# Patient Record
Sex: Female | Born: 1960 | Race: White | Hispanic: No | Marital: Married | Smoking: Never smoker
Health system: Southern US, Community
[De-identification: ages and names within clinical notes are randomized; demographics above are authoritative.]

## PROBLEM LIST (undated history)

## (undated) DIAGNOSIS — Z789 Other specified health status: Secondary | ICD-10-CM

## (undated) DIAGNOSIS — F329 Major depressive disorder, single episode, unspecified: Secondary | ICD-10-CM

## (undated) DIAGNOSIS — M5136 Other intervertebral disc degeneration, lumbar region: Secondary | ICD-10-CM

## (undated) DIAGNOSIS — T7840XA Allergy, unspecified, initial encounter: Secondary | ICD-10-CM

## (undated) DIAGNOSIS — F32A Depression, unspecified: Secondary | ICD-10-CM

## (undated) DIAGNOSIS — D72829 Elevated white blood cell count, unspecified: Secondary | ICD-10-CM

## (undated) HISTORY — DX: Depression, unspecified: F32.A

## (undated) HISTORY — DX: Other specified health status: Z78.9

## (undated) HISTORY — DX: Other intervertebral disc degeneration, lumbar region: M51.36

## (undated) HISTORY — DX: Allergy, unspecified, initial encounter: T78.40XA

## (undated) HISTORY — DX: Major depressive disorder, single episode, unspecified: F32.9

## (undated) HISTORY — DX: Elevated white blood cell count, unspecified: D72.829

## (undated) HISTORY — PX: ABDOMINAL HYSTERECTOMY: SHX81

---

## 2000-12-10 ENCOUNTER — Encounter: Payer: Self-pay | Admitting: Family Medicine

## 2000-12-10 ENCOUNTER — Encounter: Admission: RE | Admit: 2000-12-10 | Discharge: 2000-12-10 | Payer: Self-pay | Admitting: Family Medicine

## 2000-12-12 ENCOUNTER — Encounter: Payer: Self-pay | Admitting: Family Medicine

## 2000-12-12 ENCOUNTER — Encounter: Admission: RE | Admit: 2000-12-12 | Discharge: 2000-12-12 | Payer: Self-pay | Admitting: Family Medicine

## 2005-05-02 ENCOUNTER — Ambulatory Visit: Payer: Self-pay | Admitting: Internal Medicine

## 2010-06-19 ENCOUNTER — Encounter: Payer: Self-pay | Admitting: Family Medicine

## 2011-07-12 ENCOUNTER — Ambulatory Visit (INDEPENDENT_AMBULATORY_CARE_PROVIDER_SITE_OTHER): Payer: BC Managed Care – PPO | Admitting: Family Medicine

## 2011-07-12 ENCOUNTER — Encounter: Payer: Self-pay | Admitting: Family Medicine

## 2011-07-12 DIAGNOSIS — J301 Allergic rhinitis due to pollen: Secondary | ICD-10-CM

## 2011-07-12 DIAGNOSIS — F325 Major depressive disorder, single episode, in full remission: Secondary | ICD-10-CM | POA: Insufficient documentation

## 2011-07-12 DIAGNOSIS — F3289 Other specified depressive episodes: Secondary | ICD-10-CM

## 2011-07-12 DIAGNOSIS — F329 Major depressive disorder, single episode, unspecified: Secondary | ICD-10-CM

## 2011-07-12 DIAGNOSIS — Z780 Asymptomatic menopausal state: Secondary | ICD-10-CM

## 2011-07-12 LAB — LIPID PANEL
Cholesterol: 207 mg/dL — ABNORMAL HIGH (ref 0–200)
HDL: 90 mg/dL (ref >39.00)
Total CHOL/HDL Ratio: 2
Triglycerides: 33 mg/dL (ref 0.0–149.0)
VLDL: 6.6 mg/dL (ref 0.0–40.0)

## 2011-07-12 LAB — CBC WITH DIFFERENTIAL/PLATELET
Basophils Relative: 0.2 % (ref 0.0–3.0)
Eosinophils Absolute: 0 10*3/uL (ref 0.0–0.7)
MCHC: 34.8 g/dL (ref 30.0–36.0)
MCV: 96.6 fl (ref 78.0–100.0)
Monocytes Absolute: 0.3 10*3/uL (ref 0.1–1.0)
Neutrophils Relative %: 47.6 % (ref 43.0–77.0)
Platelets: 212 10*3/uL (ref 150.0–400.0)
RBC: 4.09 Mil/uL (ref 3.87–5.11)
RDW: 12.5 % (ref 11.5–14.6)

## 2011-07-12 LAB — POCT URINALYSIS DIPSTICK
Protein, UA: NEGATIVE
Spec Grav, UA: <=1.005
Urobilinogen, UA: 0.2

## 2011-07-12 LAB — TSH: TSH: 1.22 u[IU]/mL (ref 0.35–5.50)

## 2011-07-12 LAB — BASIC METABOLIC PANEL
BUN: 18 mg/dL (ref 6–23)
CO2: 28 mEq/L (ref 19–32)
Calcium: 9.4 mg/dL (ref 8.4–10.5)
Chloride: 100 mEq/L (ref 96–112)
Creatinine, Ser: 0.9 mg/dL (ref 0.4–1.2)
GFR: 71.24 mL/min (ref >60.00)
Glucose, Bld: 96 mg/dL (ref 70–99)
Potassium: 5 mEq/L (ref 3.5–5.1)
Sodium: 137 mEq/L (ref 135–145)

## 2011-07-12 LAB — HEPATIC FUNCTION PANEL
ALT: 26 U/L (ref 0–35)
AST: 25 U/L (ref 0–37)
Albumin: 4.4 g/dL (ref 3.5–5.2)
Alkaline Phosphatase: 41 U/L (ref 39–117)
Bilirubin, Direct: 0.1 mg/dL (ref 0.0–0.3)
Total Bilirubin: 0.9 mg/dL (ref 0.3–1.2)
Total Protein: 7.1 g/dL (ref 6.0–8.3)

## 2011-07-12 MED ORDER — IMIPRAMINE PAMOATE 100 MG PO CAPS: 100.0000 mg | ORAL_CAPSULE | Freq: Every day | ORAL | Status: DC

## 2011-07-12 NOTE — Progress Notes (Signed)
  Subjective:    Patient ID: Carmen Allen, female    DOB: 01/23/1961, 51 y.o.   MRN: [id number]  HPI Carmen Allen is a 51 year old married female G0 P0 who had a TAH and BSO when she was 51 years of age for endometriosis who comes in today for a general medical exam  She has a history of mild depression for which she takes imipramine 100 mg daily  She has tried to taper off in the past but every time she does she feels depressed  She takes HRT the her GYN  She has a history of migraine headaches however has not had a migraine in a couple years  She has a history of allergic rhinitis for which she takes Zyrtec 10 mg daily  She gets routine dental care. Never had a colonoscopy. Unknown about tetanus booster. Family history negative except for father had diabetes and social history she is married lives here in [place] works as a Art therapist for a company. Nonsmoker and exercises on a regular basis   Review of Systems  Constitutional: Negative.   HENT: Negative.   Eyes: Negative.   Respiratory: Negative.   Cardiovascular: Negative.   Gastrointestinal: Negative.   Genitourinary: Negative.   Musculoskeletal: Negative.   Neurological: Negative.   Hematological: Negative.   Psychiatric/Behavioral: Negative.        Objective:   Physical Exam  Constitutional: She appears well-developed and well-nourished.  HENT:  Head: Normocephalic and atraumatic.  Right Ear: External ear normal.  Left Ear: External ear normal.  Nose: Nose normal.  Mouth/Throat: Oropharynx is clear and moist.  Eyes: EOM are normal. Pupils are equal, round, and reactive to light.  Neck: Normal range of motion. Neck supple. No thyromegaly present.  Cardiovascular: Normal rate, regular rhythm, normal heart sounds and intact distal pulses.  Exam reveals no gallop and no friction rub.   No murmur heard. Pulmonary/Chest: Effort normal and breath sounds normal.  Abdominal: Soft. Bowel sounds are normal. She  exhibits no distension and no mass. There is no tenderness. There is no rebound.  Musculoskeletal: Normal range of motion.  Lymphadenopathy:    She has no cervical adenopathy.  Neurological: She is alert. She has normal reflexes. No cranial nerve deficit. She exhibits normal muscle tone. Coordination normal.  Skin: Skin is warm and dry.  Psychiatric: She has a normal mood and affect. Her behavior is normal. Judgment and thought content normal.          Assessment & Plan:  Healthy female  Allergic rhinitis continue Zyrtec  History of depression continued continue imipramine  History of hysterectomy and BSO at age 89 HRT via GYN recommend she consider tapering

## 2011-07-12 NOTE — Patient Instructions (Signed)
Continue your good health habits  Take a baby aspirin daily  We will set you up for a screening colonoscopy  I will call you I gets her lab work back  Return in one year or sooner if any problems

## 2011-09-19 ENCOUNTER — Encounter: Payer: Self-pay | Admitting: Gastroenterology

## 2012-05-29 HISTORY — PX: COLONOSCOPY: SHX174

## 2012-07-17 ENCOUNTER — Other Ambulatory Visit: Payer: Self-pay | Admitting: Family Medicine

## 2012-07-30 ENCOUNTER — Ambulatory Visit: Payer: BC Managed Care – PPO | Admitting: Family Medicine

## 2012-09-04 ENCOUNTER — Ambulatory Visit: Payer: BC Managed Care – PPO | Admitting: Family Medicine

## 2012-10-10 ENCOUNTER — Encounter: Payer: Self-pay | Admitting: Family Medicine

## 2012-10-10 ENCOUNTER — Other Ambulatory Visit: Payer: Self-pay | Admitting: *Deleted

## 2012-10-10 ENCOUNTER — Ambulatory Visit (INDEPENDENT_AMBULATORY_CARE_PROVIDER_SITE_OTHER): Payer: BC Managed Care – PPO | Admitting: Family Medicine

## 2012-10-10 VITALS — BP 102/60 | Temp 97.5°F | Wt 138.0 lb

## 2012-10-10 DIAGNOSIS — M5136 Other intervertebral disc degeneration, lumbar region: Secondary | ICD-10-CM

## 2012-10-10 DIAGNOSIS — Z780 Asymptomatic menopausal state: Secondary | ICD-10-CM

## 2012-10-10 DIAGNOSIS — Z23 Encounter for immunization: Secondary | ICD-10-CM

## 2012-10-10 DIAGNOSIS — M51369 Other intervertebral disc degeneration, lumbar region without mention of lumbar back pain or lower extremity pain: Secondary | ICD-10-CM

## 2012-10-10 DIAGNOSIS — F329 Major depressive disorder, single episode, unspecified: Secondary | ICD-10-CM

## 2012-10-10 DIAGNOSIS — M5137 Other intervertebral disc degeneration, lumbosacral region: Secondary | ICD-10-CM

## 2012-10-10 HISTORY — DX: Other intervertebral disc degeneration, lumbar region without mention of lumbar back pain or lower extremity pain: M51.369

## 2012-10-10 HISTORY — DX: Other intervertebral disc degeneration, lumbar region: M51.36

## 2012-10-10 MED ORDER — IMIPRAMINE PAMOATE 100 MG PO CAPS: ORAL_CAPSULE | ORAL | Status: DC

## 2012-10-10 NOTE — Patient Instructions (Signed)
Continue the Toprol one daily at bedtime  We will get you set up for a consult in GI to discuss a colonoscopy  Return in one year for general medical exam sooner if any problem

## 2012-10-10 NOTE — Progress Notes (Signed)
  Subjective:    Patient ID: Carmen Allen, female    DOB: 13-Jan-1961, 52 y.o.   MRN: [id number]  HPI Carmen Allen is a 52 year old female nonsmoker who comes in today for general physical examination because of a history of mild depression  She's been on Tofranil p.m. 100 mg and feels well and wishes to continue that medication  She takes estrogen 1 mg daily via her GYN. We discussed long-term negative effects of hormone replacement therapy  She gets routine eye care, dental care, BSE monthly, and you mammography, due for colonoscopy,,,,,,,, request will be sent to GI,,,,,,,,, tetanus booster unknown will give her a booster today.  She recently had an orthopedic evaluation because of soreness in her hip. She runs about 12 miles per week. She saw Dr. Farris Has x-ray shows some degenerative disease of her lumbar spine. She's relatively asymptomatic.   Review of Systems  Constitutional: Negative.   HENT: Negative.   Eyes: Negative.   Respiratory: Negative.   Cardiovascular: Negative.   Gastrointestinal: Negative.   Genitourinary: Negative.   Musculoskeletal: Negative.   Neurological: Negative.   Psychiatric/Behavioral: Negative.        Objective:   Physical Exam  Constitutional: She appears well-developed and well-nourished.  HENT:  Head: Normocephalic and atraumatic.  Right Ear: External ear normal.  Left Ear: External ear normal.  Nose: Nose normal.  Mouth/Throat: Oropharynx is clear and moist.  Eyes: EOM are normal. Pupils are equal, round, and reactive to light.  Neck: Normal range of motion. Neck supple. No thyromegaly present.  Cardiovascular: Normal rate, regular rhythm, normal heart sounds and intact distal pulses.  Exam reveals no gallop and no friction rub.   No murmur heard. Pulmonary/Chest: Effort normal and breath sounds normal.  Abdominal: Soft. Bowel sounds are normal. She exhibits no distension and no mass. There is no tenderness. There is no rebound.  Genitourinary:   Bilateral breast exam normal  Musculoskeletal: Normal range of motion.  Lymphadenopathy:    She has no cervical adenopathy.  Neurological: She is alert. She has normal reflexes. No cranial nerve deficit. She exhibits normal muscle tone. Coordination normal.  Skin: Skin is warm and dry.  Total body skin exam normal  Psychiatric: She has a normal mood and affect. Her behavior is normal. Judgment and thought content normal.          Assessment & Plan:  Healthy female  History of mild depression continue Tofranil 100 mg each bedtime  Post menopausal continue hormonal tablets 1 mg daily  Degenerative disc disease by x-ray,,,,,,,,,, consider changing to a different type of exercise program. She however does like her writing.

## 2012-11-11 ENCOUNTER — Encounter: Payer: Self-pay | Admitting: Gastroenterology

## 2012-12-31 ENCOUNTER — Ambulatory Visit (AMBULATORY_SURGERY_CENTER): Payer: BC Managed Care – PPO

## 2012-12-31 ENCOUNTER — Encounter: Payer: Self-pay | Admitting: Gastroenterology

## 2012-12-31 VITALS — Ht 67.5 in | Wt 137.6 lb

## 2012-12-31 DIAGNOSIS — Z1211 Encounter for screening for malignant neoplasm of colon: Secondary | ICD-10-CM

## 2012-12-31 MED ORDER — MOVIPREP 100 G PO SOLR: ORAL | Status: DC

## 2013-01-14 ENCOUNTER — Encounter: Payer: Self-pay | Admitting: Gastroenterology

## 2013-01-14 ENCOUNTER — Ambulatory Visit (AMBULATORY_SURGERY_CENTER): Payer: BC Managed Care – PPO | Admitting: Gastroenterology

## 2013-01-14 VITALS — BP 114/73 | HR 72 | Temp 98.3°F | Resp 13 | Ht 67.5 in | Wt 137.0 lb

## 2013-01-14 DIAGNOSIS — Z1211 Encounter for screening for malignant neoplasm of colon: Secondary | ICD-10-CM

## 2013-01-14 DIAGNOSIS — D126 Benign neoplasm of colon, unspecified: Secondary | ICD-10-CM

## 2013-01-14 MED ORDER — SODIUM CHLORIDE 0.9 % IV SOLN: 500.0000 mL | INTRAVENOUS | Status: DC

## 2013-01-14 NOTE — Progress Notes (Signed)
The pt tolerated the colonoscopy very well. Maw   

## 2013-01-14 NOTE — Op Note (Signed)
[facility] [address] [place], [address]   COLONOSCOPY PROCEDURE REPORT  PATIENT: Carmen Allen, Carmen Allen  MR#: [id number] BIRTHDATE: 01/06/1961 , 51  yrs. old GENDER: Female ENDOSCOPIST: Rachael Fee, MD REFERRED EX:[id number] Shawnie Dapper, M.D. PROCEDURE DATE:  01/14/2013 PROCEDURE:   Colonoscopy with snare polypectomy First Screening Colonoscopy - Avg.  risk and is 50 yrs.  old or older Yes.  Prior Negative Screening - Now for repeat screening. N/A  History of Adenoma - Now for follow-up colonoscopy & has been > or = to 3 yrs.  N/A  Polyps Removed Today? Yes. ASA CLASS:   Class II INDICATIONS:average risk screening. MEDICATIONS: Fentanyl 75 mcg IV, Versed 6 mg IV, and These medications were titrated to patient response per physician's verbal order  DESCRIPTION OF PROCEDURE:   After the risks benefits and alternatives of the procedure were thoroughly explained, informed consent was obtained.  A digital rectal exam revealed no abnormalities of the rectum.   The [id number]  endoscope was introduced through the anus and advanced to the cecum, which was identified by both the appendix and ileocecal valve. No adverse events experienced.   The quality of the prep was good, using MoviPrep  The instrument was then slowly withdrawn as the colon was fully examined.  COLON FINDINGS: Two polyps were found, removed and sent to pathology.  One was sessile, located in cecum, 4mm across, removed with cold snare.  The other was pedunculated, 10mm, located in sigmoid, removed with snare/cautery.  The examination was otherwise normal.  Retroflexed views revealed no abnormalities. The time to cecum=6 minutes 35 seconds.  Withdrawal time=9 minutes 12 seconds. The scope was withdrawn and the procedure completed. COMPLICATIONS: There were no complications.  ENDOSCOPIC IMPRESSION: Two polyps were found, removed and sent to pathology. The examination was otherwise  normal.  RECOMMENDATIONS: If the polyp(s) removed today are proven to be adenomatous (pre-cancerous) polyps, you will need a colonoscopy in 3 years. Otherwise you should continue to follow colorectal cancer screening guidelines for "routine risk" patients with a colonoscopy in 10 years.  You will receive a letter within 1-2 weeks with the results of your biopsy as well as final recommendations.  Please call my office if you have not received a letter after 3 weeks.   eSigned:  Rachael Fee, MD 01/14/2013 9:17 AM

## 2013-01-14 NOTE — Patient Instructions (Addendum)
YOU HAD AN ENDOSCOPIC PROCEDURE TODAY AT THE [facility]: Refer to the procedure report that was given to you for any specific questions about what was found during the examination.  If the procedure report does not answer your questions, please call your gastroenterologist to clarify.  If you requested that your care partner not be given the details of your procedure findings, then the procedure report has been included in a sealed envelope for you to review at your convenience later.  YOU SHOULD EXPECT: Some feelings of bloating in the abdomen. Passage of more gas than usual.  Walking can help get rid of the air that was put into your GI tract during the procedure and reduce the bloating. If you had a lower endoscopy (such as a colonoscopy or flexible sigmoidoscopy) you may notice spotting of blood in your stool or on the toilet paper. If you underwent a bowel prep for your procedure, then you may not have a normal bowel movement for a few days.  DIET: Your first meal following the procedure should be a light meal and then it is ok to progress to your normal diet.  A half-sandwich or bowl of soup is an example of a good first meal.  Heavy or fried foods are harder to digest and may make you feel nauseous or bloated.  Likewise meals heavy in dairy and vegetables can cause extra gas to form and this can also increase the bloating.  Drink plenty of fluids but you should avoid alcoholic beverages for 24 hours.  ACTIVITY: Your care partner should take you home directly after the procedure.  You should plan to take it easy, moving slowly for the rest of the day.  You can resume normal activity the day after the procedure however you should NOT DRIVE or use heavy machinery for 24 hours (because of the sedation medicines used during the test).    SYMPTOMS TO REPORT IMMEDIATELY: A gastroenterologist can be reached at any hour.  During normal business hours, 8:30 AM to 5:00 PM Monday through Friday,  call [phone].  After hours and on weekends, please call the GI answering service at [phone]  Emergency number who will take a message and have the physician on call contact you.   Following lower endoscopy (colonoscopy or flexible sigmoidoscopy):  Excessive amounts of blood in the stool  Significant tenderness or worsening of abdominal pains  Swelling of the abdomen that is new, acute  Fever of 100F or higher  FOLLOW UP: If any biopsies were taken you will be contacted by phone or by letter within the next 1-3 weeks.  Call your gastroenterologist if you have not heard about the biopsies in 3 weeks.  Our staff will call the home number listed on your records the next business day following your procedure to check on you and address any questions or concerns that you may have at that time regarding the information given to you following your procedure. This is a courtesy call and so if there is no answer at the home number and we have not heard from you through the emergency physician on call, we will assume that you have returned to your regular daily activities without incident.  SIGNATURES/CONFIDENTIALITY: You and/or your care partner have signed paperwork which will be entered into your electronic medical record.  These signatures attest to the fact that that the information above on your After Visit Summary has been reviewed and is understood.  Full responsibility of the   confidentiality of this discharge information lies with you and/or your care-partner.  Handout on polyps  

## 2013-01-14 NOTE — Progress Notes (Signed)
Patient did not experience any of the following events: a burn prior to discharge; a fall within the facility; wrong site/side/patient/procedure/implant event; or a hospital transfer or hospital admission upon discharge from the facility. (G8907)Patient did not have preoperative order for IV antibiotic SSI prophylaxis. (G8918) ewm 

## 2013-01-15 ENCOUNTER — Telehealth: Payer: Self-pay

## 2013-01-15 NOTE — Telephone Encounter (Signed)
  Follow up Call-  Call back number 01/14/2013  Post procedure Call Back phone  # [phone]  Permission to leave phone message Yes     Patient questions:  Do you have a fever, pain , or abdominal swelling? no Pain Score  0 *  Have you tolerated food without any problems? yes  Have you been able to return to your normal activities? yes  Do you have any questions about your discharge instructions: Diet   no Medications  no Follow up visit  no  Do you have questions or concerns about your Care? no  Actions: * If pain score is 4 or above: No action needed, pain <4.

## 2013-01-17 ENCOUNTER — Telehealth: Payer: Self-pay | Admitting: Gastroenterology

## 2013-01-17 NOTE — Telephone Encounter (Signed)
Left message on machine to call back  

## 2013-01-20 NOTE — Telephone Encounter (Signed)
Several messages have been left for the pt I will wait for further communication

## 2013-01-23 ENCOUNTER — Encounter: Payer: Self-pay | Admitting: Gastroenterology

## 2013-04-03 ENCOUNTER — Other Ambulatory Visit: Payer: Self-pay

## 2013-09-22 ENCOUNTER — Other Ambulatory Visit: Payer: Self-pay | Admitting: Family Medicine

## 2013-11-08 ENCOUNTER — Other Ambulatory Visit: Payer: Self-pay | Admitting: Family Medicine

## 2013-11-12 ENCOUNTER — Ambulatory Visit (INDEPENDENT_AMBULATORY_CARE_PROVIDER_SITE_OTHER): Payer: No Typology Code available for payment source | Admitting: Family Medicine

## 2013-11-12 ENCOUNTER — Encounter: Payer: Self-pay | Admitting: Family Medicine

## 2013-11-12 VITALS — BP 100/70 | Temp 97.8°F | Wt 142.0 lb

## 2013-11-12 DIAGNOSIS — Z780 Asymptomatic menopausal state: Secondary | ICD-10-CM

## 2013-11-12 DIAGNOSIS — F32A Depression, unspecified: Secondary | ICD-10-CM

## 2013-11-12 DIAGNOSIS — J301 Allergic rhinitis due to pollen: Secondary | ICD-10-CM

## 2013-11-12 DIAGNOSIS — F3289 Other specified depressive episodes: Secondary | ICD-10-CM

## 2013-11-12 DIAGNOSIS — F329 Major depressive disorder, single episode, unspecified: Secondary | ICD-10-CM

## 2013-11-12 LAB — CBC WITH DIFFERENTIAL/PLATELET
BASOS PCT: 0.3 % (ref 0.0–3.0)
Basophils Absolute: 0 10*3/uL (ref 0.0–0.1)
EOS PCT: 0.5 % (ref 0.0–5.0)
Eosinophils Absolute: 0 10*3/uL (ref 0.0–0.7)
HCT: 37.7 % (ref 36.0–46.0)
HEMOGLOBIN: 12.8 g/dL (ref 12.0–15.0)
LYMPHS ABS: 1.4 10*3/uL (ref 0.7–4.0)
LYMPHS PCT: 35.6 % (ref 12.0–46.0)
MCHC: 34.1 g/dL (ref 30.0–36.0)
MCV: 97.5 fl (ref 78.0–100.0)
MONOS PCT: 9.1 % (ref 3.0–12.0)
Monocytes Absolute: 0.3 10*3/uL (ref 0.1–1.0)
NEUTROS ABS: 2.1 10*3/uL (ref 1.4–7.7)
Neutrophils Relative %: 54.5 % (ref 43.0–77.0)
Platelets: 236 10*3/uL (ref 150.0–400.0)
RBC: 3.86 Mil/uL — AB (ref 3.87–5.11)
RDW: 12.7 % (ref 11.5–15.5)
WBC: 3.8 10*3/uL — AB (ref 4.0–10.5)

## 2013-11-12 LAB — POCT URINALYSIS DIPSTICK
BILIRUBIN UA: NEGATIVE
GLUCOSE UA: NEGATIVE
KETONES UA: NEGATIVE
Leukocytes, UA: NEGATIVE
Nitrite, UA: NEGATIVE
PH UA: 6.5
Protein, UA: NEGATIVE
RBC UA: NEGATIVE
Urobilinogen, UA: 0.2

## 2013-11-12 LAB — BASIC METABOLIC PANEL
BUN: 20 mg/dL (ref 6–23)
CHLORIDE: 102 meq/L (ref 96–112)
CO2: 27 mEq/L (ref 19–32)
Calcium: 9.3 mg/dL (ref 8.4–10.5)
Creatinine, Ser: 0.9 mg/dL (ref 0.4–1.2)
GFR: 73.44 mL/min (ref >60.00)
GLUCOSE: 138 mg/dL — AB (ref 70–99)
Potassium: 3.8 mEq/L (ref 3.5–5.1)
SODIUM: 137 meq/L (ref 135–145)

## 2013-11-12 LAB — HEPATIC FUNCTION PANEL
ALBUMIN: 4.3 g/dL (ref 3.5–5.2)
ALK PHOS: 35 U/L — AB (ref 39–117)
ALT: 25 U/L (ref 0–35)
AST: 30 U/L (ref 0–37)
Bilirubin, Direct: 0 mg/dL (ref 0.0–0.3)
TOTAL PROTEIN: 6.7 g/dL (ref 6.0–8.3)
Total Bilirubin: 0.5 mg/dL (ref 0.2–1.2)

## 2013-11-12 LAB — LIPID PANEL
CHOL/HDL RATIO: 2
Cholesterol: 199 mg/dL (ref 0–200)
HDL: 90.7 mg/dL (ref >39.00)
LDL CALC: 90 mg/dL (ref 0–99)
NONHDL: 108.3
Triglycerides: 91 mg/dL (ref 0.0–149.0)
VLDL: 18.2 mg/dL (ref 0.0–40.0)

## 2013-11-12 LAB — TSH: TSH: 0.7 u[IU]/mL (ref 0.35–4.50)

## 2013-11-12 MED ORDER — IMIPRAMINE PAMOATE 100 MG PO CAPS: ORAL_CAPSULE | ORAL | Status: DC

## 2013-11-12 MED ORDER — FLUTICASONE PROPIONATE 50 MCG/ACT NA SUSP: NASAL | Status: DC

## 2013-11-12 NOTE — Progress Notes (Signed)
Pre visit review using our clinic review tool, if applicable. No additional management support is needed unless otherwise documented below in the visit note. 

## 2013-11-12 NOTE — Patient Instructions (Signed)
Zyrtec plain.......Marland Kitchen 1 at bedtime  Steroid nasal spray.........Marland Kitchen 1 shot up each nostril at bedtime  Tophi no 100 mg.......Marland Kitchen 1 at bedtime  Return in one year sooner if any problems

## 2013-11-12 NOTE — Progress Notes (Signed)
   Subjective:    Patient ID: Carmen Allen, female    DOB: [birth year], 53 y.o.   MRN: [id number]  HPI Carmen Allen is a 53 year old female nonsmoker who comes in today for yearly evaluation  She sees a GYN in [place] who gives her estrogen 1 mg daily. Should her uterus and ovaries removed at age 66 for nonmalignant reasons. She had endometriosis  She is on Tofranil 100 mg each bedtime for mild depression and doing well.  She also has allergic rhinitis worse in the spring and the fall. She took some steroid nasal spray and it seemed to help  She gets routine eye care, dental care, does not do BSE monthly does get a mammogram yearly normal, colonoscopy at age 47 normal. Vaccinations up-to-date tetanus 2014  Carmen Allen is married,,,,,,,,, a runner,,,,,,,,,,, 15 miles a week   Review of Systems  Constitutional: Negative.   HENT: Negative.   Eyes: Negative.   Respiratory: Negative.   Cardiovascular: Negative.   Gastrointestinal: Negative.   Genitourinary: Negative.   Musculoskeletal: Negative.   Neurological: Negative.   Psychiatric/Behavioral: Negative.        Objective:   Physical Exam  Nursing note and vitals reviewed. Constitutional: She appears well-developed and well-nourished.  HENT:  Head: Normocephalic and atraumatic.  Right Ear: External ear normal.  Left Ear: External ear normal.  Nose: Nose normal.  Mouth/Throat: Oropharynx is clear and moist.  Eyes: EOM are normal. Pupils are equal, round, and reactive to light.  Neck: Normal range of motion. Neck supple. No thyromegaly present.  Cardiovascular: Normal rate, regular rhythm, normal heart sounds and intact distal pulses.  Exam reveals no gallop and no friction rub.   No murmur heard. Pulmonary/Chest: Effort normal and breath sounds normal.  Abdominal: Soft. Bowel sounds are normal. She exhibits no distension and no mass. There is no tenderness. There is no rebound.  Musculoskeletal: Normal range of motion.    Lymphadenopathy:    She has no cervical adenopathy.  Neurological: She is alert. She has normal reflexes. No cranial nerve deficit. She exhibits normal muscle tone. Coordination normal.  Skin: Skin is warm and dry.   Total body skin exam normal  Psychiatric: She has a normal mood and affect. Her behavior is normal. Judgment and thought content normal.          Assessment & Plan:  Healthy female  Status post TAH and BSO at age 54........Marland Kitchen on estrogen replacement by GYN  History of mild depression continue Tofranil 100 mg each bedtime  Allergic rhinitis,,,,,,,,,, plain Zyrtec with steroid nasal spray each bedtime when necessary,

## 2014-06-29 ENCOUNTER — Other Ambulatory Visit: Payer: Self-pay | Admitting: Family Medicine

## 2014-06-29 MED ORDER — IMIPRAMINE HCL 50 MG PO TABS: 100.0000 mg | ORAL_TABLET | Freq: Every day | ORAL | Status: DC

## 2014-06-29 NOTE — Telephone Encounter (Signed)
Patient would like imipramine 100 capsule  Sent to [facility] on [address].  She states the cost for imipromine PM cost is more now that she has changed insurance companies. Please do not re-fill the PM but just send regular imipramine 100.   She is leaving town tomorrow and would like to know if the rx can be sent before then?  She is also switching pharmacies and would like [facility] removed and [facility] on [address] added.

## 2014-06-29 NOTE — Telephone Encounter (Signed)
It looks like only the 50 mg comes plain.  Could you call and ask the patient if that is what she wants?

## 2014-06-29 NOTE — Telephone Encounter (Signed)
Ok per Dr Sherren Mocha to change to the imipramine 50 mg 2 caps qhs.  Rx sent in electronically to [facility], pt aware

## 2014-10-28 ENCOUNTER — Other Ambulatory Visit: Payer: Self-pay | Admitting: Family Medicine

## 2014-10-28 ENCOUNTER — Telehealth: Payer: Self-pay | Admitting: *Deleted

## 2014-10-28 NOTE — Telephone Encounter (Signed)
Noted in health maintenance.

## 2014-10-28 NOTE — Telephone Encounter (Signed)
Pt said she had one last fall and will have another one this fall. [facility] will send her a reminder card when she is due

## 2014-10-28 NOTE — Telephone Encounter (Signed)
Left a message for the pt to call the office back (need date of last mammogram).

## 2014-11-24 ENCOUNTER — Ambulatory Visit (INDEPENDENT_AMBULATORY_CARE_PROVIDER_SITE_OTHER): Payer: 59 | Admitting: Family Medicine

## 2014-11-24 ENCOUNTER — Encounter: Payer: Self-pay | Admitting: Family Medicine

## 2014-11-24 VITALS — BP 100/70 | HR 82 | Temp 97.5°F | Ht 67.25 in | Wt 139.2 lb

## 2014-11-24 DIAGNOSIS — Z789 Other specified health status: Secondary | ICD-10-CM | POA: Insufficient documentation

## 2014-11-24 DIAGNOSIS — R739 Hyperglycemia, unspecified: Secondary | ICD-10-CM | POA: Diagnosis not present

## 2014-11-24 DIAGNOSIS — F32A Depression, unspecified: Secondary | ICD-10-CM

## 2014-11-24 DIAGNOSIS — F329 Major depressive disorder, single episode, unspecified: Secondary | ICD-10-CM

## 2014-11-24 DIAGNOSIS — J301 Allergic rhinitis due to pollen: Secondary | ICD-10-CM

## 2014-11-24 DIAGNOSIS — Z7689 Persons encountering health services in other specified circumstances: Secondary | ICD-10-CM

## 2014-11-24 DIAGNOSIS — Z7189 Other specified counseling: Secondary | ICD-10-CM

## 2014-11-24 HISTORY — DX: Other specified health status: Z78.9

## 2014-11-24 LAB — HEMOGLOBIN A1C: Hgb A1c MFr Bld: 5.2 % (ref 4.6–6.5)

## 2014-11-24 NOTE — Progress Notes (Signed)
HPI:  Carmen Allen is here to establish care.  Last PCP and physical: sees gynecologist (Dr. Vella Raring) in [place] whom managed her HRT.   Has the following chronic problems that require follow up and concerns today:  Insomnia/Depression: -chronic, started in her early 36s when her grandmother died -meds: imipramine 100mg  qhs with prior PCP -reports: stable -denies:hx hospitalization, SI -hx of migraines  Seasonal allergies: -takes zyrtec, takes in the morning -nasal symptoms intermittently  ROS negative for unless reported above: fevers, unintentional weight loss, hearing or vision loss, chest pain, palpitations, struggling to breath, hemoptysis, melena, hematochezia, hematuria, falls, loc, si, thoughts of self harm  Past Medical History  Diagnosis Date  . Allergy   . Migraine   . Lumbar degenerative disc disease 10/10/2012  . No blood products 11/24/2014    Jehovah's witness   . Leukocytosis     reports this way for as long as she can remember    Past Surgical History  Procedure Laterality Date  . Abdominal hysterectomy      oophorectomy, endometriosis    Family History  Problem Relation Age of Onset  . Cancer Mother   . Diabetes Father     History   Social History  . Marital Status: Single    Spouse Name: N/A  . Number of Children: N/A  . Years of Education: N/A   Social History Main Topics  . Smoking status: Never Smoker   . Smokeless tobacco: Never Used  . Alcohol Use: 0.6 - 1.2 oz/week    1-2 Glasses of wine per week  . Drug Use: No  . Sexual Activity: Not on file   Other Topics Concern  . 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  estradiol (ESTRACE) 1 MG tablet, Take 1 mg by  mouth daily. , Disp: , Rfl:  .  imipramine (TOFRANIL) 50 MG tablet, TAKE 2 TABLETS BY MOUTH AT BEDTIME., Disp: 60 tablet, Rfl: 0  EXAM:  Filed Vitals:   11/24/14 1117  BP: 100/70  Pulse: 82  Temp: 97.5 F (36.4 C)    Body mass index is 21.64 kg/(m^2).  GENERAL: vitals reviewed and listed above, alert, oriented, appears well hydrated and in no acute distress  HEENT: atraumatic, conjunttiva clear, no obvious abnormalities on inspection of external nose and ears  NECK: no obvious masses on inspection  LUNGS: clear to auscultation bilaterally, no wheezes, rales or rhonchi, good air movement  CV: HRRR, no peripheral edema  MS: moves all extremities without noticeable abnormality  PSYCH: pleasant and cooperative, no obvious depression or anxiety  ASSESSMENT AND PLAN:  Discussed the following assessment and plan:  No blood products  Depression  Allergic rhinitis due to pollen  Hyperglycemia - Plan: Hemoglobin A1c  Encounter to establish care  -We reviewed the PMH, PSH, FH, SH, Meds and Allergies. -We provided refills for any medications we will prescribe as needed. -We addressed current concerns per orders and patient instructions. -We have asked for records for pertinent exams, studies, vaccines and notes from previous providers. -We have advised patient to follow up per instructions below.  -Patient advised to return or notify a doctor immediately  if symptoms worsen or persist or new concerns arise.  Patient Instructions  BEFORE YOU LEAVE: -labs -physical in 1 year  We recommend the following healthy lifestyle measures: - eat a healthy diet consisting of lots of vegetables, fruits, beans, nuts, seeds, healthy meats such as white chicken and fish and whole grains.  - avoid fried foods, fast food, processed foods, sodas, red meet and other fattening foods.  - get a least 150 minutes of aerobic exercise per week.   FOR IMPROVED SLEEP AND TO RESET YOUR SLEEP  SCHEDULE: []  exercise 30 minutes daily  []  go to bed and wake up at the same time  []  keep bedroom cool, dark and quiet  []  reserve bed for sleep - do not read, watch TV, etc in bed  []  If you toss and turn more then 15-20 minutes get out of bed and list thoughts/do quite activity then go back to bed; repeat as needed; do not worry about when you eventually fall asleep - still get up at the same time and turn on lights and take shower  [] get counseling  []  some people find that zyrtec at night, melatonin, tylenol pm or unisom on a few nights per week is helpful initially for a few weeks  [] seek help and treat any depression or anxiety  [] prescription strength sleep medications should only be used in severe cases of insomnia if other measures fail and should be used sparingly      Ronni Osterberg, Jarrett Soho R.

## 2014-11-24 NOTE — Progress Notes (Signed)
Pre visit review using our clinic review tool, if applicable. No additional management support is needed unless otherwise documented below in the visit note. 

## 2014-11-24 NOTE — Patient Instructions (Signed)
BEFORE YOU LEAVE: -labs -physical in 1 year  We recommend the following healthy lifestyle measures: - eat a healthy diet consisting of lots of vegetables, fruits, beans, nuts, seeds, healthy meats such as white chicken and fish and whole grains.  - avoid fried foods, fast food, processed foods, sodas, red meet and other fattening foods.  - get a least 150 minutes of aerobic exercise per week.   FOR IMPROVED SLEEP AND TO RESET YOUR SLEEP SCHEDULE: []  exercise 30 minutes daily  []  go to bed and wake up at the same time  []  keep bedroom cool, dark and quiet  []  reserve bed for sleep - do not read, watch TV, etc in bed  []  If you toss and turn more then 15-20 minutes get out of bed and list thoughts/do quite activity then go back to bed; repeat as needed; do not worry about when you eventually fall asleep - still get up at the same time and turn on lights and take shower  [] get counseling  []  some people find that zyrtec at night, melatonin, tylenol pm or unisom on a few nights per week is helpful initially for a few weeks  [] seek help and treat any depression or anxiety  [] prescription strength sleep medications should only be used in severe cases of insomnia if other measures fail and should be used sparingly

## 2014-12-01 ENCOUNTER — Other Ambulatory Visit: Payer: Self-pay | Admitting: Family Medicine

## 2014-12-03 LAB — HM MAMMOGRAPHY

## 2014-12-04 ENCOUNTER — Encounter: Payer: Self-pay | Admitting: Family Medicine

## 2014-12-31 ENCOUNTER — Telehealth: Payer: Self-pay | Admitting: Family Medicine

## 2014-12-31 MED ORDER — IMIPRAMINE HCL 50 MG PO TABS: 100.0000 mg | ORAL_TABLET | Freq: Every day | ORAL | Status: DC

## 2014-12-31 NOTE — Telephone Encounter (Signed)
Pt needs new rx imipramine 50 mg #180 for 90 day supply call into [facility] [address] [phone]

## 2015-06-25 ENCOUNTER — Other Ambulatory Visit: Payer: Self-pay | Admitting: Family Medicine

## 2015-12-15 ENCOUNTER — Encounter: Payer: Self-pay | Admitting: Gastroenterology

## 2015-12-22 ENCOUNTER — Other Ambulatory Visit: Payer: Self-pay | Admitting: Family Medicine

## 2015-12-24 ENCOUNTER — Telehealth: Payer: Self-pay | Admitting: Family Medicine

## 2015-12-24 NOTE — Telephone Encounter (Signed)
[place] with me if ok with Dr. Martinique.. I only saw her once > 1 year ago.

## 2015-12-24 NOTE — Telephone Encounter (Signed)
Pt would like to switch to Dr Martinique. Pt only seen Dr Maudie Mercury one time, nothing personal, her 3 sisters see Dr Martinique and now that she is at this practice, she would like to switch. Is that ok Dr Maudie Mercury? Is that ok with you DR Martinique?

## 2015-12-24 NOTE — Telephone Encounter (Signed)
Ok with me 

## 2016-01-05 NOTE — Telephone Encounter (Signed)
Pt has been scheduled.  °

## 2016-01-21 ENCOUNTER — Ambulatory Visit (INDEPENDENT_AMBULATORY_CARE_PROVIDER_SITE_OTHER): Payer: No Typology Code available for payment source | Admitting: Family Medicine

## 2016-01-21 ENCOUNTER — Encounter: Payer: Self-pay | Admitting: Family Medicine

## 2016-01-21 VITALS — BP 102/70 | HR 81 | Resp 12 | Ht 67.25 in | Wt 138.1 lb

## 2016-01-21 DIAGNOSIS — Z13220 Encounter for screening for lipoid disorders: Secondary | ICD-10-CM | POA: Diagnosis not present

## 2016-01-21 DIAGNOSIS — Z131 Encounter for screening for diabetes mellitus: Secondary | ICD-10-CM

## 2016-01-21 DIAGNOSIS — F325 Major depressive disorder, single episode, in full remission: Secondary | ICD-10-CM | POA: Diagnosis not present

## 2016-01-21 DIAGNOSIS — Z0000 Encounter for general adult medical examination without abnormal findings: Secondary | ICD-10-CM

## 2016-01-21 LAB — LIPID PANEL
Cholesterol: 191 mg/dL (ref 0–200)
HDL: 81.7 mg/dL
LDL Cholesterol: 95 mg/dL (ref 0–99)
NonHDL: 108.84
Total CHOL/HDL Ratio: 2
Triglycerides: 68 mg/dL (ref 0.0–149.0)
VLDL: 13.6 mg/dL (ref 0.0–40.0)

## 2016-01-21 LAB — COMPREHENSIVE METABOLIC PANEL WITH GFR
ALT: 15 U/L (ref 0–35)
AST: 23 U/L (ref 0–37)
Albumin: 4.4 g/dL (ref 3.5–5.2)
Alkaline Phosphatase: 36 U/L — ABNORMAL LOW (ref 39–117)
BUN: 17 mg/dL (ref 6–23)
CO2: 30 meq/L (ref 19–32)
Calcium: 9.1 mg/dL (ref 8.4–10.5)
Chloride: 100 meq/L (ref 96–112)
Creatinine, Ser: 0.97 mg/dL (ref 0.40–1.20)
GFR: 63.39 mL/min
Glucose, Bld: 86 mg/dL (ref 70–99)
Potassium: 4.6 meq/L (ref 3.5–5.1)
Sodium: 135 meq/L (ref 135–145)
Total Bilirubin: 0.8 mg/dL (ref 0.2–1.2)
Total Protein: 6.7 g/dL (ref 6.0–8.3)

## 2016-01-21 MED ORDER — IMIPRAMINE HCL 50 MG PO TABS: 100.0000 mg | ORAL_TABLET | Freq: Every day | ORAL | 3 refills | Status: DC

## 2016-01-21 NOTE — Patient Instructions (Signed)
A few things to remember from today's visit:   Routine physical examination - Plan: Lipid panel, CMP  Diabetes mellitus screening - Plan: CMP  Lipid screening - Plan: Lipid panel  Depression, major, in remission ([place]) - Plan: imipramine (TOFRANIL) 50 MG tablet   Please be sure medication list is accurate. If a new problem present, please set up appointment sooner than planned today.      At least 150 minutes of moderate exercise per week, daily brisk walking for 15-30 min is a good exercise option. Healthy diet low in saturated (animal) fats and sweets and consisting of fresh fruits and vegetables, lean meats such as fish and white chicken and whole grains.   - Vaccines:  Tdap vaccine every 10 years.  Shingles vaccine recommended at age 40, could be given after 55 years of age but not sure about insurance coverage.  Pneumonia vaccines:  Prevnar 13 at 65 and Pneumovax at 90.  Screening recommendations for low/normal risk women:  Screening for diabetes at age 21-45 and every 3 years.  Cervical cancer prevention:   N/A   -Breast cancer: Mammogram: There is disagreement between experts about when to start screening in low risk asymptomatic female but recent recommendations are to start screening at 68 and not later than 55 years old , every 1-2 years and after 55 yo q 2 years. Screening is recommended until 55 years old but some women can continue screening depending of healthy issues.   Colon cancer screening: starts at 55 years old until 55 years old.  Cholesterol disorder screening at age 59 and every 3 years.

## 2016-01-21 NOTE — Progress Notes (Signed)
Pre visit review using our clinic review tool, if applicable. No additional management support is needed unless otherwise documented below in the visit note. 

## 2016-01-21 NOTE — Progress Notes (Signed)
HPI:   [name] is a 55 y.o. female, who is here today to establish care with me.  Former PCP: Dr Sherren Mocha Last preventive routine visit: over a year ago, she would like to have routine physical today, she is fasting. She follows with her gyn for her female preventive care.   Concerns today: medication refill. She has been on Imipramine 100 mg at bedtime for many years, according to pt, it was started for depression. She denies any side effects. Denies depression symptoms. Helps her sleep as well.  Listed on her problem list lumbar DDD, no currently symptomatic.    S/P hysterectomy at age 50 years old due to endometriosis. She is currently on oral Estradiol 1 mg. She follows with gynecologist regularly.  Lives with husband.  She exercises regularly and she follows a healthy diet.   Review of Systems  Constitutional: Negative for activity change, appetite change, fatigue, fever and unexpected weight change.  HENT: Negative for dental problem, hearing loss, mouth sores, nosebleeds, trouble swallowing and voice change.   Eyes: Negative for photophobia and visual disturbance.  Respiratory: Negative for cough, shortness of breath and wheezing.   Cardiovascular: Negative for chest pain, palpitations and leg swelling.  Gastrointestinal: Negative for abdominal pain, nausea and vomiting.       No changes in bowel habits.  Endocrine: Negative for cold intolerance and heat intolerance.  Genitourinary: Negative for decreased urine volume, dysuria, hematuria, vaginal bleeding and vaginal discharge.  Musculoskeletal: Negative for arthralgias, back pain, gait problem and myalgias.  Skin: Negative for color change and rash.  Neurological: Negative for seizures, syncope, weakness, numbness and headaches.  Hematological: Negative for adenopathy. Does not bruise/bleed easily.  Psychiatric/Behavioral: Negative for confusion, sleep disturbance and suicidal ideas. The patient is not  nervous/anxious.   All other systems reviewed and are negative.     Current Outpatient Prescriptions on File Prior to Visit  Medication Sig Dispense Refill  . estradiol (ESTRACE) 1 MG tablet Take 1 mg by mouth daily.      No current facility-administered medications on file prior to visit.      Past Medical History:  Diagnosis Date  . Allergy   . Leukocytosis    reports this way for as long as she can remember  . Lumbar degenerative disc disease 10/10/2012  . Migraine   . No blood products 11/24/2014   Jehovah's witness    No Known Allergies  Family History  Problem Relation Age of Onset  . Cancer Mother   . Diabetes Father     Social History   Social History  . Marital status: Single    Spouse name: N/A  . Number of children: N/A  . Years of education: N/A   Social History Main Topics  . Smoking status: Never Smoker  . Smokeless tobacco: Never Used  . Alcohol use 0.6 - 1.2 oz/week    1 - 2 Glasses of wine per week  . Drug use: No  . Sexual activity: Not Asked   Other Topics Concern  . 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  O2 sat at RA 99%.    Physical Exam  Nursing note and vitals reviewed. Constitutional: She is oriented to person, place, and time. She appears well-developed and well-nourished. No distress.  HENT:  Head: Atraumatic.  Right Ear: Hearing, tympanic membrane, external ear and ear canal normal.  Left Ear: Hearing, tympanic membrane, external ear and ear canal normal.  Mouth/Throat: Uvula is midline, oropharynx is clear and moist and mucous membranes are normal.  Eyes: Conjunctivae and EOM are normal. Pupils are equal,  round, and reactive to light.  Neck: No thyroid mass and no thyromegaly present.  Cardiovascular: Normal rate and regular rhythm.   No murmur heard. Pulses:      Dorsalis pedis pulses are 2+ on the right side, and 2+ on the left side.       Posterior tibial pulses are 2+ on the right side, and 2+ on the left side.  Respiratory: Effort normal and breath sounds normal. No respiratory distress.  GI: Soft. She exhibits no mass. There is no hepatomegaly. There is no tenderness.  Musculoskeletal: She exhibits no edema or tenderness.  No major deformity or sign of synovitis appreciated.  Lymphadenopathy:    She has no cervical adenopathy.       Right: No supraclavicular adenopathy present.       Left: No supraclavicular adenopathy present.  Neurological: She is alert and oriented to person, place, and time. She has normal strength. No cranial nerve deficit. Coordination and gait normal.  Reflex Scores:      Bicep reflexes are 2+ on the right side and 2+ on the left side.      Patellar reflexes are 2+ on the right side and 2+ on the left side. Skin: Skin is warm. No rash noted. No erythema.  Psychiatric: She has a normal mood and affect. Her speech is normal.  Well groomed, good eye contact.      ASSESSMENT AND PLAN:     Carmen Allen was seen today for transfer.  Diagnoses and all orders for this visit:    Chemistry      Component Value Date/Time   NA 135 01/21/2016 0832   K 4.6 01/21/2016 0832   CL 100 01/21/2016 0832   CO2 30 01/21/2016 0832   BUN 17 01/21/2016 0832   CREATININE 0.97 01/21/2016 0832      Component Value Date/Time   CALCIUM 9.1 01/21/2016 0832   ALKPHOS 36 (L) 01/21/2016 0832   AST 23 01/21/2016 0832   ALT 15 01/21/2016 0832   BILITOT 0.8 01/21/2016 0832     Lab Results  Component Value Date   CHOL 191 01/21/2016   HDL 81.70 01/21/2016   LDLCALC 95 01/21/2016   LDLDIRECT 106.3 07/12/2011   TRIG 68.0 01/21/2016   CHOLHDL 2 01/21/2016    Routine  physical examination   We discussed the importance of regular physical activity and healthy diet for prevention of chronic illness and/or complications. Preventive guidelines reviewed. Vaccination up to date.       She will continue following with gynecology for her female preventive care       She was an appointment with GI for colonoscopy. Ca++ and vit D supplementation recommended. Next CPE in 1 year.   -     Lipid panel -     CMP  Diabetes mellitus screening -     CMP  Lipid screening -     Lipid panel  Depression, major, in remission ([place])  Asymptomatic, she is not interested in weaning medication off or changing dose. No changes in current management. Since depression is well controlled and she has been on this medication for many years, I think it is appropriate to follow annually, before if needed.   -     imipramine (TOFRANIL) 50 MG tablet; Take 2 tablets (100 mg total) by mouth at bedtime.           Betty G. Martinique, MD  [facility]. [place] office.

## 2016-01-22 ENCOUNTER — Encounter: Payer: Self-pay | Admitting: Family Medicine

## 2017-02-08 NOTE — Progress Notes (Signed)
HPI:   [name] is a 56 y.o. female, who is here today for her routine physical.  Last CPE 01/03/16. She follows with her gyn for her preventive gyn care, last visit 02/05/17.   Regular exercise 3 or more time per week: Yes Following a healthy diet: Yes.  She lives with her husband.  Chronic medical problems: depression and DDD.  She is on Imipramine 100 mg at bedtime, which she has taken for many years and still helps with depression. She denies side effects or suicidal thoughts.  S/P hysterectomy.  Mammogram:11/2014. She already received a letter reminding ehr to set up appt. FHx neg for ovarian or breast cancer. She discontinued Estradiol 1 mg, has not had hot flashes.  Colonoscopy: 12/2012. According to pt, 3 years f/u was recommended because 2 polyps. She had severe abdominal pain that lasted about a week after her last colonoscopy, so she is afraid of having another one. FHx negative for colon cancer.States that her gyn did a guaiac in her office during last OV.  Denies abdominal pain, nausea, vomiting, changes in bowel habits, blood in stool or melena.     Immunization History  Administered Date(s) Administered  . Tdap 10/10/2012     Hep C screening: not done before.She denies risk factors and she is not interested in having it done.  She has no concerns today.   Review of Systems  Constitutional: Negative for activity change, appetite change, fatigue, fever and unexpected weight change.  HENT: Negative for dental problem, hearing loss, mouth sores, sore throat, trouble swallowing and voice change.   Eyes: Positive for itching. Negative for redness and visual disturbance.  Respiratory: Negative for cough, shortness of breath and wheezing.   Cardiovascular: Negative for chest pain and leg swelling.  Gastrointestinal: Negative for abdominal pain, nausea and vomiting.       No changes in bowel habits.  Endocrine: Negative for cold intolerance, heat  intolerance, polydipsia, polyphagia and polyuria.  Genitourinary: Negative for decreased urine volume, dysuria, hematuria, vaginal bleeding and vaginal discharge.  Musculoskeletal: Negative for arthralgias, back pain and neck pain.  Skin: Negative for pallor and rash.  Allergic/Immunologic: Positive for environmental allergies.  Neurological: Negative for syncope, weakness, numbness and headaches.  Hematological: Negative for adenopathy. Does not bruise/bleed easily.  Psychiatric/Behavioral: Negative for confusion and sleep disturbance. The patient is not nervous/anxious.   All other systems reviewed and are negative.    No current outpatient prescriptions on file prior to visit.   No current facility-administered medications on file prior to visit.      Past Medical History:  Diagnosis Date  . Allergy   . Depression   . Leukocytosis    reports this way for as long as she can remember  . Lumbar degenerative disc disease 10/10/2012  . Migraine   . No blood products 11/24/2014   Jehovah's witness    Past Surgical History:  Procedure Laterality Date  . ABDOMINAL HYSTERECTOMY     oophorectomy, endometriosis    No Known Allergies  Family History  Problem Relation Age of Onset  . Cancer Mother   . Diabetes Father   . Mental illness Sister        anxiety  . Mental illness Sister        ADHD    Social History   Social History  . Marital status: Single    Spouse name: N/A  . Number of children: N/A  . Years of education: N/A  Social History Main Topics  . Smoking status: Never Smoker  . Smokeless tobacco: Never Used  . Alcohol use 0.6 - 1.2 oz/week    1 - 2 Glasses of wine per week  . Drug use: No  . Sexual activity: Yes   Other Topics Concern  . 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   Wt Readings from Last 3 Encounters:  02/09/17 137 lb 4 oz (62.3 kg)  01/21/16 138 lb 2 oz (62.7 kg)  11/24/14 139 lb 3.2 oz (63.1 kg)    Physical Exam  Nursing note and vitals reviewed. Constitutional: She is oriented to person, place, and time. She appears well-developed and well-nourished. No distress.  HENT:  Head: Atraumatic.  Right Ear: Hearing, tympanic membrane, external ear and ear canal normal.  Left Ear: Hearing, tympanic membrane, external ear and ear canal normal.  Mouth/Throat: Uvula is midline, oropharynx is clear and moist and mucous membranes are normal.  Eyes: Pupils are equal, round, and reactive to light. EOM and lids are normal. Right conjunctiva is injected. Left conjunctiva is injected.  Mild peripheral conjunctival injection, no drainage.  Neck: No tracheal deviation present. No thyroid mass and no thyromegaly present.  Cardiovascular: Normal rate and regular rhythm.   No murmur heard. Pulses:      Dorsalis pedis pulses are 2+ on the right side, and 2+ on the left side.  Respiratory: Effort normal and breath sounds normal. No respiratory distress.  GI: Soft. She exhibits no mass. There is no hepatomegaly. There is no tenderness.  Genitourinary:  Genitourinary Comments: Deferred to gyn.  Musculoskeletal: She exhibits no edema or tenderness.  No major deformity and no signs of synovitis appreciated.  Lymphadenopathy:    She has no cervical adenopathy.       Right: No supraclavicular adenopathy present.       Left: No supraclavicular adenopathy present.  Neurological: She is alert and oriented to person, place, and time. She has normal strength. No cranial nerve deficit or sensory deficit. Coordination and gait normal.  Reflex Scores:      Bicep reflexes are 2+ on the right side and 2+ on the left  side.      Patellar reflexes are 2+ on the right side and 2+ on the left side. Skin: Skin is warm. No rash noted. No erythema.  Psychiatric: She has a normal mood and affect. Her speech is normal.  Well groomed, good eye contact.     ASSESSMENT AND PLAN:   [name] was seen today for annual exam.  Diagnoses and all orders for this visit:  Lab Results  Component Value Date   CREATININE 0.98 02/09/2017   BUN 21 02/09/2017   NA 139 02/09/2017   K 4.4 02/09/2017   CL 99 02/09/2017   CO2 31 02/09/2017   Lab Results  Component Value Date   CHOL 213 (H) 02/09/2017   HDL 113.40 02/09/2017   LDLCALC 88 02/09/2017   LDLDIRECT 106.3 07/12/2011   TRIG 59.0 02/09/2017   CHOLHDL 2 02/09/2017  Lab Results  Component Value Date   ALT 15 02/09/2017   AST 23 02/09/2017   ALKPHOS 45 02/09/2017   BILITOT 0.7 02/09/2017    Routine general medical examination at a health care facility  We discussed the importance of regular physical activity and healthy diet for prevention of chronic illness and/or complications. Preventive guidelines reviewed. Vaccination up to date, refused influenza vaccine. Aspirin for primary prevention discussed, she may benefit for colon cancer prevention.Side effects discussed. Ca++ and vit D supplementation recommended. Eye exam every 1-2 years. Continue following with gyn for ehr gyn preventive care. Instructed to arrange mammogram and colonoscopy as recommended.  Next CPE in 1 year.   The ASCVD Risk score Mikey Bussing DC Jr., et al., 2013) failed to calculate for the following reasons:   The valid HDL cholesterol range is 20 to 100 mg/dL  Diabetes mellitus screening -     Comprehensive metabolic panel  Screening for lipid disorders -     Lipid panel  Depression, major, in remission ([facility]) -     imipramine (TOFRANIL) 50 MG tablet; Take 2 tablets (100 mg total) by mouth at bedtime.  Allergic conjunctivitis of both eyes  She has no concerns in this  regard today. Mild. She needs to follow with her eye care provider if not better and seek immediate medical attention if worsening.  -     Olopatadine HCl (PAZEO) 0.7 % SOLN; Apply 1 drop to eye daily.    Return in 1 year (on 02/09/2018).          Kyli Sorter G. Martinique, MD  [facility]. [place] office.

## 2017-02-09 ENCOUNTER — Encounter: Payer: Self-pay | Admitting: Family Medicine

## 2017-02-09 ENCOUNTER — Ambulatory Visit (INDEPENDENT_AMBULATORY_CARE_PROVIDER_SITE_OTHER): Payer: No Typology Code available for payment source | Admitting: Family Medicine

## 2017-02-09 VITALS — BP 106/70 | HR 93 | Resp 12 | Ht 67.25 in | Wt 137.2 lb

## 2017-02-09 DIAGNOSIS — Z131 Encounter for screening for diabetes mellitus: Secondary | ICD-10-CM | POA: Diagnosis not present

## 2017-02-09 DIAGNOSIS — Z1159 Encounter for screening for other viral diseases: Secondary | ICD-10-CM

## 2017-02-09 DIAGNOSIS — Z0000 Encounter for general adult medical examination without abnormal findings: Secondary | ICD-10-CM

## 2017-02-09 DIAGNOSIS — F325 Major depressive disorder, single episode, in full remission: Secondary | ICD-10-CM | POA: Diagnosis not present

## 2017-02-09 DIAGNOSIS — Z9189 Other specified personal risk factors, not elsewhere classified: Secondary | ICD-10-CM

## 2017-02-09 DIAGNOSIS — Z13220 Encounter for screening for lipoid disorders: Secondary | ICD-10-CM

## 2017-02-09 DIAGNOSIS — H1013 Acute atopic conjunctivitis, bilateral: Secondary | ICD-10-CM

## 2017-02-09 LAB — LIPID PANEL
CHOL/HDL RATIO: 2
Cholesterol: 213 mg/dL — ABNORMAL HIGH (ref 0–200)
HDL: 113.4 mg/dL (ref >39.00)
LDL Cholesterol: 88 mg/dL (ref 0–99)
NonHDL: 99.89
Triglycerides: 59 mg/dL (ref 0.0–149.0)
VLDL: 11.8 mg/dL (ref 0.0–40.0)

## 2017-02-09 LAB — COMPREHENSIVE METABOLIC PANEL
ALT: 15 U/L (ref 0–35)
AST: 23 U/L (ref 0–37)
Albumin: 4.5 g/dL (ref 3.5–5.2)
Alkaline Phosphatase: 45 U/L (ref 39–117)
BILIRUBIN TOTAL: 0.7 mg/dL (ref 0.2–1.2)
BUN: 21 mg/dL (ref 6–23)
CO2: 31 meq/L (ref 19–32)
Calcium: 9.7 mg/dL (ref 8.4–10.5)
Chloride: 99 mEq/L (ref 96–112)
Creatinine, Ser: 0.98 mg/dL (ref 0.40–1.20)
GFR: 62.4 mL/min (ref >60.00)
GLUCOSE: 86 mg/dL (ref 70–99)
Potassium: 4.4 mEq/L (ref 3.5–5.1)
Sodium: 139 mEq/L (ref 135–145)
Total Protein: 6.8 g/dL (ref 6.0–8.3)

## 2017-02-09 MED ORDER — IMIPRAMINE HCL 50 MG PO TABS: 100.0000 mg | ORAL_TABLET | Freq: Every day | ORAL | 3 refills | Status: DC

## 2017-02-09 MED ORDER — OLOPATADINE HCL 0.7 % OP SOLN: 1.0000 [drp] | Freq: Every day | OPHTHALMIC | 2 refills | Status: DC

## 2017-02-09 NOTE — Patient Instructions (Signed)
A few things to remember from today's visit:   Encounter for HCV screening test for high risk patient  Diabetes mellitus screening - Plan: Comprehensive metabolic panel  Screening for lipid disorders - Plan: Lipid panel  Routine general medical examination at a health care facility  Depression, major, in remission ([place]) - Plan: imipramine (TOFRANIL) 50 MG tablet    At least 150 minutes of moderate exercise per week, daily brisk walking for 15-30 min is a good exercise option. Healthy diet low in saturated (animal) fats and sweets and consisting of fresh fruits and vegetables, lean meats such as fish and white chicken and whole grains.   - Vaccines:  Tdap vaccine every 10 years.  Shingles vaccine recommended at age 2, could be given after 56 years of age but not sure about insurance coverage.  Pneumonia vaccines:  Prevnar 13 at 65 and Pneumovax at 32.  Screening recommendations for low/normal risk women:  Screening for diabetes at age 6-45 and every 3 years.  Cervical cancer prevention:  S/p hysterectomy.  -Breast cancer: Mammogram: There is disagreement between experts about when to start screening in low risk asymptomatic female but recent recommendations are to start screening at 44 and not later than 56 years old , every 1-2 years and after 57 yo q 2 years. Screening is recommended until 56 years old but some women can continue screening depending of healthy issues.   Colon cancer screening: starts at 56 years old until 56 years old.  Cholesterol disorder screening at age 71 and every 3 years.  Also recommended:  1. Dental visit- Brush and floss your teeth twice daily; visit your dentist twice a year. 2. Eye doctor- Get an eye exam at least every 2 years. 3. Helmet use- Always wear a helmet when riding a bicycle, motorcycle, rollerblading or skateboarding. 4. Safe sex- If you may be exposed to sexually transmitted infections, use a condom. 5. Seat belts- Seat belts  can save your live; always wear one. 6. Smoke/Carbon Monoxide detectors- These detectors need to be installed on the appropriate level of your home. Replace batteries at least once a year. 7. Skin cancer- When out in the sun please cover up and use sunscreen 15 SPF or higher. 8. Violence- If anyone is threatening or hurting you, please tell your healthcare provider.  9. Drink alcohol in moderation- Limit alcohol intake to one drink or less per day. Never drink and drive.  Please be sure medication list is accurate. If a new problem present, please set up appointment sooner than planned today.

## 2017-02-15 ENCOUNTER — Encounter: Payer: Self-pay | Admitting: Family Medicine

## 2017-06-04 ENCOUNTER — Encounter: Payer: Self-pay | Admitting: Gastroenterology

## 2017-06-06 ENCOUNTER — Ambulatory Visit (INDEPENDENT_AMBULATORY_CARE_PROVIDER_SITE_OTHER): Payer: No Typology Code available for payment source | Admitting: Family Medicine

## 2017-06-06 ENCOUNTER — Encounter: Payer: Self-pay | Admitting: Family Medicine

## 2017-06-06 VITALS — BP 122/68 | HR 75 | Temp 98.2°F | Resp 12 | Ht 67.25 in | Wt 141.1 lb

## 2017-06-06 DIAGNOSIS — G4700 Insomnia, unspecified: Secondary | ICD-10-CM

## 2017-06-06 DIAGNOSIS — F325 Major depressive disorder, single episode, in full remission: Secondary | ICD-10-CM

## 2017-06-06 LAB — TSH: TSH: 2.49 u[IU]/mL (ref 0.35–4.50)

## 2017-06-06 MED ORDER — CLONAZEPAM 0.5 MG PO TABS: 0.2500 mg | ORAL_TABLET | Freq: Two times a day (BID) | ORAL | 1 refills | Status: DC | PRN

## 2017-06-06 NOTE — Patient Instructions (Signed)
A few things to remember from today's visit:   Insomnia, unspecified type - Plan: clonazePAM (KLONOPIN) 0.5 MG tablet, TSH  Insomnia Insomnia is a sleep disorder that makes it difficult to fall asleep or to stay asleep. Insomnia can cause tiredness (fatigue), low energy, difficulty concentrating, mood swings, and poor performance at work or school. There are three different ways to classify insomnia:  Difficulty falling asleep.  Difficulty staying asleep.  Waking up too early in the morning.  Any type of insomnia can be long-term (chronic) or short-term (acute). Both are common. Short-term insomnia usually lasts for three months or less. Chronic insomnia occurs at least three times a week for longer than three months. What are the causes? Insomnia may be caused by another condition, situation, or substance, such as:  Anxiety.  Certain medicines.  Gastroesophageal reflux disease (GERD) or other gastrointestinal conditions.  Asthma or other breathing conditions.  Restless legs syndrome, sleep apnea, or other sleep disorders.  Chronic pain.  Menopause. This may include hot flashes.  Stroke.  Abuse of alcohol, tobacco, or illegal drugs.  Depression.  Caffeine.  Neurological disorders, such as Alzheimer disease.  An overactive thyroid (hyperthyroidism).  The cause of insomnia may not be known. What increases the risk? Risk factors for insomnia include:  Gender. Women are more commonly affected than men.  Age. Insomnia is more common as you get older.  Stress. This may involve your professional or personal life.  Income. Insomnia is more common in people with lower income.  Lack of exercise.  Irregular work schedule or night shifts.  Traveling between different time zones.  What are the signs or symptoms? If you have insomnia, trouble falling asleep or trouble staying asleep is the main symptom. This may lead to other symptoms, such as:  Feeling  fatigued.  Feeling nervous about going to sleep.  Not feeling rested in the morning.  Having trouble concentrating.  Feeling irritable, anxious, or depressed.  How is this treated? Treatment for insomnia depends on the cause. If your insomnia is caused by an underlying condition, treatment will focus on addressing the condition. Treatment may also include:  Medicines to help you sleep.  Counseling or therapy.  Lifestyle adjustments.  Follow these instructions at home:  Take medicines only as directed by your health care provider.  Keep regular sleeping and waking hours. Avoid naps.  Keep a sleep diary to help you and your health care provider figure out what could be causing your insomnia. Include: ? When you sleep. ? When you wake up during the night. ? How well you sleep. ? How rested you feel the next day. ? Any side effects of medicines you are taking. ? What you eat and drink.  Make your bedroom a comfortable place where it is easy to fall asleep: ? Put up shades or special blackout curtains to block light from outside. ? Use a white noise machine to block noise. ? Keep the temperature cool.  Exercise regularly as directed by your health care provider. Avoid exercising right before bedtime.  Use relaxation techniques to manage stress. Ask your health care provider to suggest some techniques that may work well for you. These may include: ? Breathing exercises. ? Routines to release muscle tension. ? Visualizing peaceful scenes.  Cut back on alcohol, caffeinated beverages, and cigarettes, especially close to bedtime. These can disrupt your sleep.  Do not overeat or eat spicy foods right before bedtime. This can lead to digestive discomfort that can make it  hard for you to sleep.  Limit screen use before bedtime. This includes: ? Watching TV. ? Using your smartphone, tablet, and computer.  Stick to a routine. This can help you fall asleep faster. Try to do a  quiet activity, brush your teeth, and go to bed at the same time each night.  Get out of bed if you are still awake after 15 minutes of trying to sleep. Keep the lights down, but try reading or doing a quiet activity. When you feel sleepy, go back to bed.  Make sure that you drive carefully. Avoid driving if you feel very sleepy.  Keep all follow-up appointments as directed by your health care provider. This is important. Contact a health care provider if:  You are tired throughout the day or have trouble in your daily routine due to sleepiness.  You continue to have sleep problems or your sleep problems get worse. Get help right away if:  You have serious thoughts about hurting yourself or someone else. This information is not intended to replace advice given to you by your health care provider. Make sure you discuss any questions you have with your health care provider. Document Released: 05/12/2000 Document Revised: 10/15/2015 Document Reviewed: 02/13/2014 Elsevier Interactive Patient Education  2018 [employer].  Please be sure medication list is accurate. If a new problem present, please set up appointment sooner than planned today.

## 2017-06-06 NOTE — Progress Notes (Signed)
ACUTE VISIT   HPI:  Chief Complaint  Patient presents with  . Insomnia    Carmen Allen is a 57 y.o. female, who is here today complaining of 2 years of difficulty falling and staying asleep,problem is getting worse. She wakes up around 1:30-2:00 am and takes her about 1-2 hours to go back to sleep. She wakes up again at 5:30 Am and not able to go back to sleep. Sleeping about 4-5 hours.  Problems happens "most days" of the week. She has not identified exacerbating or alleviating factors. She has not tried OTC medications.  Last night she took one of her husband's Clonazepam and slept through the night.  She did not noted side effects.   Lab Results  Component Value Date   TSH 0.70 11/12/2013   History of depression, which she reports as well controlled since she has been on Imipramine. She denies suicidal thoughts.  History of anxiety, in the past she took Clonazepam, she denies any current symptom.  I  Review of Systems  Constitutional: Positive for fatigue. Negative for activity change, appetite change and unexpected weight change.  Respiratory: Negative for shortness of breath and wheezing.   Cardiovascular: Negative for chest pain and palpitations.  Gastrointestinal: Negative for abdominal pain, nausea and vomiting.  Endocrine: Negative for cold intolerance and heat intolerance.  Skin: Negative for rash.  Neurological: Negative for tremors and headaches.  Psychiatric/Behavioral: Positive for sleep disturbance. Negative for confusion and suicidal ideas. The patient is not nervous/anxious.       Current Outpatient Medications on File Prior to Visit  Medication Sig Dispense Refill  . imipramine (TOFRANIL) 50 MG tablet Take 2 tablets (100 mg total) by mouth at bedtime. 180 tablet 3  . Olopatadine HCl (PAZEO) 0.7 % SOLN Apply 1 drop to eye daily. (Patient not taking: Reported on 06/06/2017) 1 Bottle 2   No current facility-administered medications on  file prior to visit.      Past Medical History:  Diagnosis Date  . Allergy   . Depression   . Leukocytosis    reports this way for as long as she can remember  . Lumbar degenerative disc disease 10/10/2012  . Migraine   . No blood products 11/24/2014   Jehovah's witness    No Known Allergies  Social History   Socioeconomic History  . Marital status: Married    Spouse name: None  . Number of children: None  . Years of education: None  . Highest education level: None  Social Needs  . Financial resource strain: None  . Food insecurity - worry: None  . Food insecurity - inability: None  . Transportation needs - medical: None  . Transportation needs - non-medical: None  Occupational History  . None  Tobacco Use  . Smoking status: Never Smoker  . Smokeless tobacco: Never Used  Substance and Sexual Activity  . Alcohol use: Yes    Alcohol/week: 0.6 - 1.2 oz    Types: 1 - 2 Glasses of wine per week  . Drug use: No  . Sexual activity: Yes  Other Topics Concern  . 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   Physical Exam  Nursing note and vitals reviewed. Constitutional: She is oriented to person, place, and time. She appears well-developed and well-nourished. No distress.  HENT:  Head: Normocephalic and atraumatic.  Mouth/Throat: Oropharynx is clear and moist and mucous membranes are normal.  Eyes: Conjunctivae are normal. Pupils are equal, round, and reactive to light.  Neck: No tracheal deviation present. No thyroid mass and no thyromegaly present.  Cardiovascular: Normal rate and regular rhythm.  No murmur  heard. Respiratory: Effort normal and breath sounds normal. No respiratory distress.  Musculoskeletal: She exhibits no edema or tenderness.  Lymphadenopathy:    She has no cervical adenopathy.  Neurological: She is alert and oriented to person, place, and time. She has normal strength. Coordination and gait normal.  Skin: Skin is warm. No erythema.  Psychiatric: She has a normal mood and affect.  Well groomed, good eye contact.    ASSESSMENT AND PLAN:   Carmen Allen was seen today for insomnia.  Diagnoses and all orders for this visit: Lab Results  Component Value Date   TSH 2.49 06/06/2017    Insomnia, unspecified type  We discussed possible causes. Good sleep hygiene strongly recommended. She has tolerated Clonazepam when the past and it seems like it helps with sleep, she would like to have a prescription. Clonazepam side effect discussed. Follow-up in 3 months.  -     clonazePAM (KLONOPIN) 0.5 MG tablet; Take 0.5-1 tablets (0.25-0.5 mg total) by mouth 2 (two) times daily as needed for anxiety. -     TSH  Depression, major, in remission ([place])  Well controlled. No changes in current management. Instructed about warning signs.  25 min face to face OV. > 50% was dedicated to discussion of Dx, prognosis, treatment options, and side effects of medication.     Return in about 3 months (around 09/04/2017) for Insomnia.       Damean Poffenberger G. Martin[place], MD  [facility]. [place] office.

## 2017-07-03 ENCOUNTER — Ambulatory Visit: Payer: No Typology Code available for payment source | Admitting: Family Medicine

## 2017-07-03 DIAGNOSIS — Z0289 Encounter for other administrative examinations: Secondary | ICD-10-CM

## 2017-07-09 ENCOUNTER — Other Ambulatory Visit: Payer: Self-pay

## 2017-07-09 ENCOUNTER — Ambulatory Visit (AMBULATORY_SURGERY_CENTER): Payer: Self-pay | Admitting: *Deleted

## 2017-07-09 VITALS — Ht 67.5 in | Wt 140.0 lb

## 2017-07-09 DIAGNOSIS — Z86010 Personal history of colonic polyps: Secondary | ICD-10-CM

## 2017-07-09 MED ORDER — PEG 3350-KCL-NA BICARB-NACL 420 G PO SOLR: 4000.0000 mL | Freq: Once | ORAL | 0 refills | Status: AC

## 2017-07-09 NOTE — Progress Notes (Signed)
Patient denies any allergies to eggs or soy. Patient denies any problems with anesthesia/sedation. Patient denies any oxygen use at home. Patient denies taking any diet/weight loss medications or blood thinners. EMMI education assisgned to patient on colonoscopy, this was explained and instructions given to patient. Patient states that after last colonoscopy she had abdominal pain for days afterwards, the pain felt like endometriosis pain per pain but then it went away on it's on.

## 2017-07-23 ENCOUNTER — Other Ambulatory Visit: Payer: Self-pay

## 2017-07-23 ENCOUNTER — Encounter: Payer: Self-pay | Admitting: Gastroenterology

## 2017-07-23 ENCOUNTER — Ambulatory Visit (AMBULATORY_SURGERY_CENTER): Payer: No Typology Code available for payment source | Admitting: Gastroenterology

## 2017-07-23 VITALS — BP 115/76 | HR 83 | Temp 98.4°F | Resp 21 | Ht 67.0 in | Wt 140.0 lb

## 2017-07-23 DIAGNOSIS — Z86010 Personal history of colonic polyps: Secondary | ICD-10-CM

## 2017-07-23 DIAGNOSIS — D120 Benign neoplasm of cecum: Secondary | ICD-10-CM | POA: Diagnosis not present

## 2017-07-23 MED ORDER — SODIUM CHLORIDE 0.9 % IV SOLN: 500.0000 mL | Freq: Once | INTRAVENOUS | Status: DC

## 2017-07-23 NOTE — Progress Notes (Signed)
A and O x3. Report to RN. Tolerated MAC anesthesia well.

## 2017-07-23 NOTE — Op Note (Signed)
[facility] Patient Name: Carmen Allen Procedure Date: 07/23/2017 8:17 AM MRN: [id number] Endoscopist: Milus Banister , MD Age: 57 Referring MD:  Date of Birth: 1961/01/28 Gender: Female Account #: [id number] Procedure:                Colonoscopy Indications:              High risk colon cancer surveillance: Personal                            history of colonic polyps; colonoscopy 2014 Dr.                            Ardis Hughs found 2 polyps, one subCM TA and one 48mm TVA Medicines:                Monitored Anesthesia Care Procedure:                Pre-Anesthesia Assessment:                           - Prior to the procedure, a History and Physical                            was performed, and patient medications and                            allergies were reviewed. The patient's tolerance of                            previous anesthesia was also reviewed. The risks                            and benefits of the procedure and the sedation                            options and risks were discussed with the patient.                            All questions were answered, and informed consent                            was obtained. Prior Anticoagulants: The patient has                            taken no previous anticoagulant or antiplatelet                            agents. ASA Grade Assessment: II - A patient with                            mild systemic disease. After reviewing the risks                            and benefits, the patient was deemed in  satisfactory condition to undergo the procedure.                           After obtaining informed consent, the colonoscope                            was passed under direct vision. Throughout the                            procedure, the patient's blood pressure, pulse, and                            oxygen saturations were monitored continuously. The   Colonoscope was introduced through the anus and                            advanced to the the cecum, identified by                            appendiceal orifice and ileocecal valve. The                            colonoscopy was performed without difficulty. The                            patient tolerated the procedure well. The quality                            of the bowel preparation was good. The ileocecal                            valve, appendiceal orifice, and rectum were                            photographed. Scope In: 8:21:03 AM Scope Out: 8:34:01 AM Scope Withdrawal Time: 0 hours 8 minutes 0 seconds  Total Procedure Duration: 0 hours 12 minutes 58 seconds  Findings:                 Three sessile polyps were found in the cecum. The                            polyps were 2 to 3 mm in size. These polyps were                            removed with a cold snare. Resection and retrieval                            were complete.                           The exam was otherwise without abnormality on                            direct and retroflexion views. Complications:  No immediate complications. Estimated blood loss:                            None. Estimated Blood Loss:     Estimated blood loss: none. Impression:               - Three 2 to 3 mm polyps in the cecum, removed with                            a cold snare. Resected and retrieved.                           - The examination was otherwise normal on direct                            and retroflexion views. Recommendation:           - Patient has a contact number available for                            emergencies. The signs and symptoms of potential                            delayed complications were discussed with the                            patient. Return to normal activities tomorrow.                            Written discharge instructions were provided to the                             patient.                           - Resume previous diet.                           - Continue present medications.                           You will receive a letter within 2-3 weeks with the                            pathology results and my final recommendations.                           If the polyp(s) is proven to be 'pre-cancerous' on                            pathology, you will need repeat colonoscopy in 3-5                            years. Milus Banister, MD 07/23/2017 8:39:02 AM This report has been signed electronically.

## 2017-07-23 NOTE — Progress Notes (Signed)
Called to room to assist during endoscopic procedure.  Patient ID and intended procedure confirmed with present staff. Received instructions for my participation in the procedure from the performing physician.  

## 2017-07-23 NOTE — Progress Notes (Signed)
No problems noted in the recovery room. maw 

## 2017-07-23 NOTE — Patient Instructions (Signed)
YOU HAD AN ENDOSCOPIC PROCEDURE TODAY AT THE [facility]:   Refer to the procedure report that was given to you for any specific questions about what was found during the examination.  If the procedure report does not answer your questions, please call your gastroenterologist to clarify.  If you requested that your care partner not be given the details of your procedure findings, then the procedure report has been included in a sealed envelope for you to review at your convenience later.  YOU SHOULD EXPECT: Some feelings of bloating in the abdomen. Passage of more gas than usual.  Walking can help get rid of the air that was put into your GI tract during the procedure and reduce the bloating. If you had a lower endoscopy (such as a colonoscopy or flexible sigmoidoscopy) you may notice spotting of blood in your stool or on the toilet paper. If you underwent a bowel prep for your procedure, you may not have a normal bowel movement for a few days.  Please Note:  You might notice some irritation and congestion in your nose or some drainage.  This is from the oxygen used during your procedure.  There is no need for concern and it should clear up in a day or so.  SYMPTOMS TO REPORT IMMEDIATELY:   Following lower endoscopy (colonoscopy or flexible sigmoidoscopy):  Excessive amounts of blood in the stool  Significant tenderness or worsening of abdominal pains  Swelling of the abdomen that is new, acute  Fever of 100F or higher   For urgent or emergent issues, a gastroenterologist can be reached at any hour by calling [phone].   DIET:  We do recommend a small meal at first, but then you may proceed to your regular diet.  Drink plenty of fluids but you should avoid alcoholic beverages for 24 hours.  ACTIVITY:  You should plan to take it easy for the rest of today and you should NOT DRIVE or use heavy machinery until tomorrow (because of the sedation medicines used during the test).     FOLLOW UP: Our staff will call the number listed on your records the next business day following your procedure to check on you and address any questions or concerns that you may have regarding the information given to you following your procedure. If we do not reach you, we will leave a message.  However, if you are feeling well and you are not experiencing any problems, there is no need to return our call.  We will assume that you have returned to your regular daily activities without incident.  If any biopsies were taken you will be contacted by phone or by letter within the next 1-3 weeks.  Please call us at [phone] if you have not heard about the biopsies in 3 weeks.    SIGNATURES/CONFIDENTIALITY: You and/or your care partner have signed paperwork which will be entered into your electronic medical record.  These signatures attest to the fact that that the information above on your After Visit Summary has been reviewed and is understood.  Full responsibility of the confidentiality of this discharge information lies with you and/or your care-partner.   Handout was given to your care partner on polyps. You may resume your current medications today. Await biopsy results. Please call if any questions or concerns.   

## 2017-07-24 ENCOUNTER — Telehealth: Payer: Self-pay

## 2017-07-24 NOTE — Telephone Encounter (Signed)
Attempted to reach pt. With follow up call following endoscopic procedure 07/23/17.  Unable to LM at pt. No.  Will try to reach pt. Again later today.

## 2017-07-24 NOTE — Telephone Encounter (Signed)
NO ANSWER, MESSAGE LEFT FOR PATIENT. 

## 2017-08-02 ENCOUNTER — Encounter: Payer: Self-pay | Admitting: Gastroenterology

## 2017-08-06 ENCOUNTER — Encounter: Payer: Self-pay | Admitting: Family Medicine

## 2017-08-12 ENCOUNTER — Other Ambulatory Visit: Payer: Self-pay | Admitting: Family Medicine

## 2017-08-12 DIAGNOSIS — G4700 Insomnia, unspecified: Secondary | ICD-10-CM

## 2017-11-27 ENCOUNTER — Encounter: Payer: Self-pay | Admitting: Family Medicine

## 2017-11-29 ENCOUNTER — Other Ambulatory Visit: Payer: Self-pay | Admitting: Family Medicine

## 2017-11-29 DIAGNOSIS — G4700 Insomnia, unspecified: Secondary | ICD-10-CM

## 2017-11-29 DIAGNOSIS — F325 Major depressive disorder, single episode, in full remission: Secondary | ICD-10-CM

## 2017-11-29 MED ORDER — CLONAZEPAM 0.5 MG PO TABS: ORAL_TABLET | ORAL | 0 refills | Status: DC

## 2017-11-29 MED ORDER — IMIPRAMINE HCL 50 MG PO TABS: 100.0000 mg | ORAL_TABLET | Freq: Every day | ORAL | 1 refills | Status: DC

## 2018-04-03 ENCOUNTER — Encounter: Payer: Self-pay | Admitting: Family Medicine

## 2018-04-03 ENCOUNTER — Ambulatory Visit (INDEPENDENT_AMBULATORY_CARE_PROVIDER_SITE_OTHER): Payer: No Typology Code available for payment source | Admitting: Family Medicine

## 2018-04-03 VITALS — BP 120/70 | HR 83 | Temp 97.9°F | Resp 12 | Ht 67.0 in | Wt 144.4 lb

## 2018-04-03 DIAGNOSIS — F325 Major depressive disorder, single episode, in full remission: Secondary | ICD-10-CM

## 2018-04-03 DIAGNOSIS — G4700 Insomnia, unspecified: Secondary | ICD-10-CM | POA: Diagnosis not present

## 2018-04-03 MED ORDER — CLONAZEPAM 0.5 MG PO TABS: 0.2500 mg | ORAL_TABLET | Freq: Every evening | ORAL | 3 refills | Status: DC | PRN

## 2018-04-03 MED ORDER — IMIPRAMINE HCL 50 MG PO TABS: 100.0000 mg | ORAL_TABLET | Freq: Every day | ORAL | 2 refills | Status: DC

## 2018-04-03 NOTE — Assessment & Plan Note (Signed)
At the time is helping with problem. No changes in current management. We discussed current recommendations in regard to control medication management as well as as side effects of benzodiazepines. Follow-up in 6 months, before if needed.

## 2018-04-03 NOTE — Assessment & Plan Note (Signed)
Problem is well controlled with current medication. No changes in imipramine 100 mg daily. Instructed about warning signs. Follow-up in 6 months.

## 2018-04-03 NOTE — Progress Notes (Signed)
HPI:   [name] is a 57 y.o. female, who is here today for 6 months follow up.   She was last seen on 06/06/2017. No new problems since her last visit.  Depression, currently she is on imipramine 50 mg 2 tablets daily at bedtime. She has taking medication for years, still well-tolerated. She denies depressed mood or suicidal thoughts.  For insomnia and anxiety, she is taking clonazepam 0.5 mg at bedtime daily. Medication helps her sleep, no drowsiness the next day. Problem seems to be exacerbated by stress. Sleeping about 7 to 8 hours.  She is tolerating medication well. No side effects reported.    Review of Systems  Constitutional: Negative for activity change, appetite change, fatigue and unexpected weight change.  Respiratory: Negative for shortness of breath and wheezing.   Cardiovascular: Negative for chest pain, palpitations and leg swelling.  Gastrointestinal: Negative for abdominal pain, diarrhea, nausea and vomiting.  Skin: Negative for rash.  Neurological: Negative for tremors, syncope and headaches.  Psychiatric/Behavioral: Negative for confusion, hallucinations and suicidal ideas. The patient is not nervous/anxious.      Current Outpatient Medications on File Prior to Visit  Medication Sig Dispense Refill  . fluticasone (FLONASE) 50 MCG/ACT nasal spray 1 spray up each nostril each bedtime     Current Facility-Administered Medications on File Prior to Visit  Medication Dose Route Frequency Provider Last Rate Last Dose  . 0.9 %  sodium chloride infusion  500 mL Intravenous Once Milus Banister, MD         Past Medical History:  Diagnosis Date  . Allergy   . Depression   . Leukocytosis    reports this way for as long as she can remember  . Lumbar degenerative disc disease 10/10/2012  . Migraine   . No blood products 11/24/2014   Jehovah's witness    No Known Allergies  Social History   Socioeconomic History  . Marital status:  Married    Spouse name: Not on file  . Number of children: Not on file  . Years of education: Not on file  . Highest education level: Not on file  Occupational History  . Not on file  Social Needs  . Financial resource strain: Not on file  . Food insecurity:    Worry: Not on file    Inability: Not on file  . Transportation needs:    Medical: Not on file    Non-medical: Not on file  Tobacco Use  . Smoking status: Never Smoker  . Smokeless tobacco: Never Used  Substance and Sexual Activity  . Alcohol use: Yes    Alcohol/week: 5.0 standard drinks    Types: 5 Glasses of wine per week  . Drug use: No  . Sexual activity: Yes  Lifestyle  . Physical activity:    Days per week: Not on file    Minutes per session: Not on file  . Stress: Not on file  Relationships  . Social connections:    Talks on phone: Not on file    Gets together: Not on file    Attends religious service: Not on file    Active member of club or organization: Not on file    Attends meetings of clubs or organizations: Not on file    Relationship status: Not on file  Other Topics Concern  . 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   Physical Exam  Nursing note and vitals reviewed. Constitutional: She is oriented to person, place, and time. She appears well-developed and well-nourished. No distress.  HENT:  Head: Normocephalic and atraumatic.  Mouth/Throat: Oropharynx is clear and moist and mucous membranes are normal.  Eyes: Pupils are equal, round, and reactive to light. Conjunctivae and EOM are normal.  Cardiovascular: Normal rate and regular rhythm.  No  murmur heard. Respiratory: Effort normal and breath sounds normal. No respiratory distress.  GI: Soft. She exhibits no mass. There is no tenderness.  Musculoskeletal: She exhibits no edema.  Lymphadenopathy:    She has no cervical adenopathy.  Neurological: She is alert and oriented to person, place, and time. She has normal strength. Gait normal.  Skin: Skin is warm. No erythema.  Psychiatric: She has a normal mood and affect. Cognition and memory are normal. She expresses no suicidal ideation. She expresses no suicidal plans.  Well-groomed, good eye contact.       ASSESSMENT AND PLAN:   [name] was seen today for 6 months follow-up.  No orders of the defined types were placed in this encounter.   Depression, major, in remission ([place]) Problem is well controlled with current medication. No changes in imipramine 100 mg daily. Instructed about warning signs. Follow-up in 6 months.  Insomnia, unspecified At the time is helping with problem. No changes in current management. We discussed current recommendations in regard to control medication management as well as as side effects of benzodiazepines. Follow-up in 6 months, before if needed.    She is planning on scheduling a CPE/gynecologic care here in the office next year. Her gynecologist retired. She refused influenza vaccine.    Carmen Pellow G. Martin[place], MD  [facility]. [place] office.

## 2018-04-03 NOTE — Patient Instructions (Signed)
A few things to remember from today's visit:   Insomnia, unspecified type  Depression, major, in remission ([place])  No changes in medications today. We will continue following every 6 months due to the fact you are taking a controlled medication.  Please be sure medication list is accurate. If a new problem present, please set up appointment sooner than planned today.

## 2018-08-02 ENCOUNTER — Other Ambulatory Visit: Payer: Self-pay | Admitting: Family Medicine

## 2018-08-02 DIAGNOSIS — G4700 Insomnia, unspecified: Secondary | ICD-10-CM

## 2018-08-08 ENCOUNTER — Encounter: Payer: Self-pay | Admitting: Family Medicine

## 2018-08-08 ENCOUNTER — Other Ambulatory Visit: Payer: Self-pay

## 2018-08-08 ENCOUNTER — Ambulatory Visit (INDEPENDENT_AMBULATORY_CARE_PROVIDER_SITE_OTHER): Payer: 59 | Admitting: Family Medicine

## 2018-08-08 VITALS — BP 120/74 | HR 84 | Temp 98.7°F | Wt 144.0 lb

## 2018-08-08 DIAGNOSIS — R5383 Other fatigue: Secondary | ICD-10-CM | POA: Diagnosis not present

## 2018-08-08 DIAGNOSIS — J029 Acute pharyngitis, unspecified: Secondary | ICD-10-CM | POA: Diagnosis not present

## 2018-08-08 DIAGNOSIS — J020 Streptococcal pharyngitis: Secondary | ICD-10-CM | POA: Diagnosis not present

## 2018-08-08 LAB — POCT RAPID STREP A (OFFICE): Rapid Strep A Screen: POSITIVE — AB

## 2018-08-08 LAB — POCT INFLUENZA A/B
Influenza A, POC: NEGATIVE
Influenza B, POC: NEGATIVE

## 2018-08-08 MED ORDER — AMOXICILLIN 500 MG PO CAPS: 500.0000 mg | ORAL_CAPSULE | Freq: Two times a day (BID) | ORAL | 0 refills | Status: AC

## 2018-08-08 NOTE — Patient Instructions (Signed)
Strep Throat    Strep throat is a bacterial infection of the throat. Your health care provider may call the infection tonsillitis or pharyngitis, depending on whether there is swelling in the tonsils or at the back of the throat. Strep throat is most common during the cold months of the year in children who are 5-58 years of age, but it can happen during any season in people of any age. This infection is spread from person to person (contagious) through coughing, sneezing, or close contact.  What are the causes?  Strep throat is caused by the bacteria called Streptococcus pyogenes.  What increases the risk?  This condition is more likely to develop in:  · People who spend time in crowded places where the infection can spread easily.  · People who have close contact with someone who has strep throat.  What are the signs or symptoms?  Symptoms of this condition include:  · Fever or chills.  · Redness, swelling, or pain in the tonsils or throat.  · Pain or difficulty when swallowing.  · White or yellow spots on the tonsils or throat.  · Swollen, tender glands in the neck or under the jaw.  · Red rash all over the body (rare).  How is this diagnosed?  This condition is diagnosed by performing a rapid strep test or by taking a swab of your throat (throat culture test). Results from a rapid strep test are usually ready in a few minutes, but throat culture test results are available after one or two days.  How is this treated?  This condition is treated with antibiotic medicine.  Follow these instructions at home:  Medicines  · Take over-the-counter and prescription medicines only as told by your health care provider.  · Take your antibiotic as told by your health care provider. Do not stop taking the antibiotic even if you start to feel better.  · Have family members who also have a sore throat or fever tested for strep throat. They may need antibiotics if they have the strep infection.  Eating and drinking  · Do not  share food, drinking cups, or personal items that could cause the infection to spread to other people.  · If swallowing is difficult, try eating soft foods until your sore throat feels better.  · Drink enough fluid to keep your urine clear or pale yellow.  General instructions  · Gargle with a salt-water mixture 3-4 times per day or as needed. To make a salt-water mixture, completely dissolve ½-1 tsp of salt in 1 cup of warm water.  · Make sure that all household members wash their hands well.  · Get plenty of rest.  · Stay home from school or work until you have been taking antibiotics for 24 hours.  · Keep all follow-up visits as told by your health care provider. This is important.  Contact a health care provider if:  · The glands in your neck continue to get bigger.  · You develop a rash, cough, or earache.  · You cough up a thick liquid that is green, yellow-brown, or bloody.  · You have pain or discomfort that does not get better with medicine.  · Your problems seem to be getting worse rather than better.  · You have a fever.  Get help right away if:  · You have new symptoms, such as vomiting, severe headache, stiff or painful neck, chest pain, or shortness of breath.  · You have severe throat   pain, drooling, or changes in your voice.  · You have swelling of the neck, or the skin on the neck becomes red and tender.  · You have signs of dehydration, such as fatigue, dry mouth, and decreased urination.  · You become increasingly sleepy, or you cannot wake up completely.  · Your joints become red or painful.  This information is not intended to replace advice given to you by your health care provider. Make sure you discuss any questions you have with your health care provider.  Document Released: 05/12/2000 Document Revised: 01/12/2016 Document Reviewed: 09/07/2014  Elsevier Interactive Patient Education © 2019 [employer]

## 2018-08-08 NOTE — Progress Notes (Signed)
Subjective:    Patient ID: Carmen Allen, female    DOB: 18-Mar-1961, 58 y.o.   MRN: [id number]  No chief complaint on file.   HPI Patient was seen today for acute concern.  Pt is typically seen by Dr. Betty Martinique.  Pt endorses headache, neck pain, sore throat, fatigue since Tuesday.  Thought it may be 2/2 allergies.  Started taking cetirizine and saline nasal rinse.  Patient denies fever, ear pain or pressure, cough, sick contacts.  Past Medical History:  Diagnosis Date  . Allergy   . Depression   . Leukocytosis    reports this way for as long as she can remember  . Lumbar degenerative disc disease 10/10/2012  . Migraine   . No blood products 11/24/2014   Jehovah's witness     No Known Allergies  ROS General: Denies fever, chills, night sweats, changes in weight, changes in appetite  +fatigue HEENT: Denies ear pain, changes in vision, rhinorrhea   +HAs, sore throat CV: Denies CP, palpitations, SOB, orthopnea Pulm: Denies SOB, cough, wheezing GI: Denies abdominal pain, nausea, vomiting, diarrhea, constipation GU: Denies dysuria, hematuria, frequency, vaginal discharge Msk: Denies muscle cramps, joint pains  +neck pain Neuro: Denies weakness, numbness, tingling Skin: Denies rashes, bruising Psych: Denies depression, anxiety, hallucinations    Objective:    Blood pressure 120/74, pulse 84, temperature 98.7 F (37.1 C), temperature source Oral, weight 144 lb (65.3 kg), SpO2 98 %.  Gen. Pleasant, well-nourished, in no distress, normal affect   HEENT: [place]/AT, face symmetric, no scleral icterus, PERRLA, nares patent without drainage, pharynx with mild erythema, no exudate.  TMs full bilaterally.  No cervical lymphadenopathy. Lungs: no accessory muscle use, CTAB, no wheezes or rales Cardiovascular: RRR, no m/r/g, no peripheral edema Neuro:  A&Ox3, CN II-XII intact, normal gait Skin:  Warm, no lesions/ rash   Wt Readings from Last 3 Encounters:  08/08/18 144 lb (65.3 kg)   04/03/18 144 lb 6 oz (65.5 kg)  07/23/17 140 lb (63.5 kg)    Lab Results  Component Value Date   WBC 3.8 (L) 11/12/2013   HGB 12.8 11/12/2013   HCT 37.7 11/12/2013   PLT 236.0 11/12/2013   GLUCOSE 86 02/09/2017   CHOL 213 (H) 02/09/2017   TRIG 59.0 02/09/2017   HDL 113.40 02/09/2017   LDLDIRECT 106.3 07/12/2011   LDLCALC 88 02/09/2017   ALT 15 02/09/2017   AST 23 02/09/2017   NA 139 02/09/2017   K 4.4 02/09/2017   CL 99 02/09/2017   CREATININE 0.98 02/09/2017   BUN 21 02/09/2017   CO2 31 02/09/2017   TSH 2.49 06/06/2017   HGBA1C 5.2 11/24/2014    Assessment/Plan:  Strep pharyngitis  -Rapid strep test positive -Given handout. -Advised to avoid sharing food/utensils with others.  Patient advised to change her toothbrush. -Given RTC precautions. - Plan: amoxicillin (AMOXIL) 500 MG capsule  Fatigue, unspecified type  - Plan: POC Influenza A/B negative -Rapid strep test  positive  F/u prn  Grier Mitts, MD

## 2018-11-06 ENCOUNTER — Other Ambulatory Visit: Payer: Self-pay | Admitting: Family Medicine

## 2018-11-06 DIAGNOSIS — G4700 Insomnia, unspecified: Secondary | ICD-10-CM

## 2018-11-11 ENCOUNTER — Other Ambulatory Visit: Payer: Self-pay | Admitting: Family Medicine

## 2018-11-11 DIAGNOSIS — G4700 Insomnia, unspecified: Secondary | ICD-10-CM

## 2018-11-11 NOTE — Telephone Encounter (Signed)
Pt called and is requesting to have this medication filled today. Pt states she gets sick when she is not taking it. Please advise.

## 2018-11-12 ENCOUNTER — Ambulatory Visit (INDEPENDENT_AMBULATORY_CARE_PROVIDER_SITE_OTHER): Payer: 59 | Admitting: Family Medicine

## 2018-11-12 ENCOUNTER — Other Ambulatory Visit: Payer: Self-pay

## 2018-11-12 ENCOUNTER — Encounter: Payer: Self-pay | Admitting: Family Medicine

## 2018-11-12 DIAGNOSIS — F325 Major depressive disorder, single episode, in full remission: Secondary | ICD-10-CM

## 2018-11-12 DIAGNOSIS — G4700 Insomnia, unspecified: Secondary | ICD-10-CM

## 2018-11-12 MED ORDER — CLONAZEPAM 0.5 MG PO TABS: 0.5000 mg | ORAL_TABLET | Freq: Every evening | ORAL | 3 refills | Status: DC | PRN

## 2018-11-12 MED ORDER — IMIPRAMINE HCL 50 MG PO TABS: 100.0000 mg | ORAL_TABLET | Freq: Every day | ORAL | 2 refills | Status: DC

## 2018-11-12 NOTE — Assessment & Plan Note (Signed)
Problem has been well controlled with clonazepam 0.5 mg, which she is taking almost every night. No changes in current management. Good sleep hygiene is also recommended. She is aware of side effects of benzodiazepines.  controlled substance report was reviewed. I will plan on doing urine tox next visit. Follow-up in 5 to 6 months, before if needed.

## 2018-11-12 NOTE — Assessment & Plan Note (Signed)
Problem is well controlled. Continue imipramine 100 mg daily. Follow-up in 5 to 6 months, before if needed.

## 2018-11-12 NOTE — Progress Notes (Signed)
Virtual Visit via Video Note   I connected with Carmen Allen on 11/12/18 at  8:00 AM EDT by a video enabled telemedicine application and verified that I am speaking with the correct person using two identifiers.  Location patient: home Location provider:work office Persons participating in the virtual visit: patient, provider  I discussed the limitations of evaluation and management by telemedicine and the availability of in person appointments. The patient expressed understanding and agreed to proceed.   HPI:  Carmen Allen is a 58 years old female with history of allergies, depression, and insomnia among some, who today is following on some chronic medical problems. Depression, currently she is on imipramine 50 mg, she takes 2 tablets daily. She denies depressed mood or suicidal thoughts. Tolerating medication well with no side effects.  Insomnia, which seems to be caused by anxiety. Currently she is on clonazepam 0.5 mg, which now she is taking daily. She denies side effects. Sleeps about 7 to 8 hours and the next day she feels rested.  Negative for headache, palpitations, chest pain, dyspnea, abdominal pain, nausea, vomiting, or numbness.  No other concerns today.  ROS: See pertinent positives and negatives per HPI.  Past Medical History:  Diagnosis Date  . Allergy   . Depression   . Leukocytosis    reports this way for as long as she can remember  . Lumbar degenerative disc disease 10/10/2012  . Migraine   . No blood products 11/24/2014   Jehovah's witness     Past Surgical History:  Procedure Laterality Date  . ABDOMINAL HYSTERECTOMY     oophorectomy, endometriosis  . COLONOSCOPY  2014    Family History  Problem Relation Age of Onset  . Cancer Mother   . Diabetes Father   . Mental illness Sister        anxiety  . Mental illness Sister        ADHD  . Colon cancer Neg Hx   . Esophageal cancer Neg Hx   . Rectal cancer Neg Hx   . Stomach cancer Neg Hx      Social History   Socioeconomic History  . Marital status: Married    Spouse name: Not on file  . Number of children: Not on file  . Years of education: Not on file  . Highest education level: Not on file  Occupational History  . Not on file  Social Needs  . Financial resource strain: Not on file  . Food insecurity    Worry: Not on file    Inability: Not on file  . Transportation needs    Medical: Not on file    Non-medical: Not on file  Tobacco Use  . Smoking status: Never Smoker  . Smokeless tobacco: Never Used  Substance and Sexual Activity  . Alcohol use: Yes    Alcohol/week: 5.0 standard drinks    Types: 5 Glasses of wine per week  . Drug use: No  . Sexual activity: Yes  Lifestyle  . Physical activity    Days per week: Not on file    Minutes per session: Not on file  . Stress: Not on file  Relationships  . Social Herbalist on phone: Not on file    Gets together: Not on file    Attends religious service: Not on file    Active member of club or organization: Not on file    Attends meetings of clubs or organizations: Not on file    Relationship  status: Not on file  . Intimate partner violence    Fear of current or ex partner: Not on file    Emotionally abused: Not on file    Physically abused: Not on file    Forced sexual activity: Not on file  Other Topics Concern  . 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  clonazePAM (KLONOPIN) 0.5 MG tablet, Take 1 tablet (0.5 mg total) by mouth at bedtime as needed for anxiety., Disp: 30 tablet, Rfl: 3 .  fluticasone (FLONASE) 50 MCG/ACT nasal spray, 1 spray up each nostril each bedtime, Disp: , Rfl:  .  imipramine (TOFRANIL) 50 MG tablet, Take 2  tablets (100 mg total) by mouth at bedtime., Disp: 180 tablet, Rfl: 2  Current Facility-Administered Medications:  .  0.9 %  sodium chloride infusion, 500 mL, Intravenous, Once, Milus Banister, MD  EXAM:  VITALS per patient if applicable:N/A  GENERAL: alert, oriented, appears well and in no acute distress  HEENT: atraumatic, conjunttiva clear, no obvious facial abnormalities on inspection.  NECK: normal movements of the head and neck  LUNGS: on inspection no signs of respiratory distress, breathing rate appears normal, no obvious gross SOB, gasping or wheezing  CV: no obvious cyanosis  PSYCH/NEURO: pleasant and cooperative, no obvious depression or anxiety, speech and thought processing grossly intact  ASSESSMENT AND PLAN:  Discussed the following assessment and plan:  Insomnia, unspecified type - Plan: clonazePAM (KLONOPIN) 0.5 MG tablet,   Depression, major, in remission ([place]) - Plan: imipramine (TOFRANIL) 50 MG tablet,   Insomnia, unspecified Problem has been well controlled with clonazepam 0.5 mg, which she is taking almost every night. No changes in current management. Good sleep hygiene is also recommended. She is aware of side effects of benzodiazepines. [place] controlled substance report was reviewed. I will plan on doing urine tox next visit. Follow-up in 5 to 6 months, before if needed.  Depression, major, in remission ([place]) Problem is well controlled. Continue imipramine 100 mg daily. Follow-up in 5 to 6 months, before if needed.     I discussed the assessment and treatment plan with the patient. The patient was provided an opportunity to ask questions and all were answered. The patient agreed with the plan and demonstrated an understanding of the instructions.     Return in about 5 months (around 04/14/2019) for CPE and f/u 5-6 months.    Uri Turnbough Martinique, MD

## 2019-01-07 ENCOUNTER — Other Ambulatory Visit: Payer: Self-pay

## 2019-01-07 ENCOUNTER — Ambulatory Visit (INDEPENDENT_AMBULATORY_CARE_PROVIDER_SITE_OTHER): Payer: 59 | Admitting: Family Medicine

## 2019-01-07 ENCOUNTER — Encounter: Payer: Self-pay | Admitting: Family Medicine

## 2019-01-07 VITALS — BP 118/74 | HR 80 | Temp 98.1°F | Resp 12 | Ht 67.0 in | Wt 140.1 lb

## 2019-01-07 DIAGNOSIS — G4700 Insomnia, unspecified: Secondary | ICD-10-CM | POA: Diagnosis not present

## 2019-01-07 DIAGNOSIS — Z0000 Encounter for general adult medical examination without abnormal findings: Secondary | ICD-10-CM

## 2019-01-07 DIAGNOSIS — E785 Hyperlipidemia, unspecified: Secondary | ICD-10-CM | POA: Diagnosis not present

## 2019-01-07 DIAGNOSIS — F325 Major depressive disorder, single episode, in full remission: Secondary | ICD-10-CM

## 2019-01-07 DIAGNOSIS — Z131 Encounter for screening for diabetes mellitus: Secondary | ICD-10-CM | POA: Diagnosis not present

## 2019-01-07 LAB — COMPREHENSIVE METABOLIC PANEL
ALT: 21 U/L (ref 0–35)
AST: 24 U/L (ref 0–37)
Albumin: 4.6 g/dL (ref 3.5–5.2)
Alkaline Phosphatase: 55 U/L (ref 39–117)
BUN: 20 mg/dL (ref 6–23)
CO2: 31 mEq/L (ref 19–32)
Calcium: 9.4 mg/dL (ref 8.4–10.5)
Chloride: 98 mEq/L (ref 96–112)
Creatinine, Ser: 0.9 mg/dL (ref 0.40–1.20)
GFR: 64.33 mL/min (ref >60.00)
Glucose, Bld: 94 mg/dL (ref 70–99)
Potassium: 4.1 mEq/L (ref 3.5–5.1)
Sodium: 136 mEq/L (ref 135–145)
Total Bilirubin: 0.8 mg/dL (ref 0.2–1.2)
Total Protein: 6.6 g/dL (ref 6.0–8.3)

## 2019-01-07 LAB — LIPID PANEL
Cholesterol: 224 mg/dL — ABNORMAL HIGH (ref 0–200)
HDL: 85.8 mg/dL (ref >39.00)
LDL Cholesterol: 127 mg/dL — ABNORMAL HIGH (ref 0–99)
NonHDL: 138.02
Total CHOL/HDL Ratio: 3
Triglycerides: 53 mg/dL (ref 0.0–149.0)
VLDL: 10.6 mg/dL (ref 0.0–40.0)

## 2019-01-07 NOTE — Progress Notes (Signed)
HPI:   Carmen Allen is a 58 y.o. female, who is here today for her routine physical.  Last CPE: 02/09/2017  Regular exercise 3 or more time per week: 3 times per week, less than her usual before COVID 19. Following a healthful diet: Yes. She lives with her husband. No children.  Chronic medical problems: Insomnia and depression among some.  Pap smear: 01/2017, S/P hysterectomy. Hx of abnormal pap smears: Neg Hx of STD's:Neg    Immunization History  Administered Date(s) Administered  . Tdap 10/10/2012    Mammogram: 2016 Colonoscopy: 06/2017 DEXA: N/A  Hep C screening: She has refused.  She has no concerns today.  Insomnia,she is on Clonazepam 0.5 mg at bedtime as needed. Depression,she is on Imipramine 50 mg 2 tabs at bedtime. She has taken med for years,well tolerated and helping.  HLD, she is on non pharmacologic treatment. Last FLP on 02/09/17: CT 213,LDL 88,TG 59,and HDL 113.  Review of Systems  Constitutional: Negative for appetite change, fatigue and fever.  HENT: Negative for dental problem, hearing loss, mouth sores, sore throat, trouble swallowing and voice change.   Eyes: Negative for redness and visual disturbance.  Respiratory: Negative for cough, shortness of breath and wheezing.   Cardiovascular: Negative for chest pain and leg swelling.  Gastrointestinal: Negative for abdominal pain, nausea and vomiting.       No changes in bowel habits.  Endocrine: Negative for cold intolerance, heat intolerance, polydipsia, polyphagia and polyuria.  Genitourinary: Negative for decreased urine volume, dysuria, hematuria, vaginal bleeding and vaginal discharge.  Musculoskeletal: Negative for arthralgias, gait problem and myalgias.  Skin: Negative for color change and rash.  Allergic/Immunologic: Negative for environmental allergies.  Neurological: Negative for syncope, weakness and headaches.  Hematological: Negative for adenopathy. Does not  bruise/bleed easily.  Psychiatric/Behavioral: Negative for confusion and sleep disturbance. The patient is not nervous/anxious.   All other systems reviewed and are negative.   Current Outpatient Medications on File Prior to Visit  Medication Sig Dispense Refill  . clonazePAM (KLONOPIN) 0.5 MG tablet Take 1 tablet (0.5 mg total) by mouth at bedtime as needed for anxiety. 30 tablet 3  . fluticasone (FLONASE) 50 MCG/ACT nasal spray 1 spray up each nostril each bedtime    . imipramine (TOFRANIL) 50 MG tablet Take 2 tablets (100 mg total) by mouth at bedtime. 180 tablet 2   Current Facility-Administered Medications on File Prior to Visit  Medication Dose Route Frequency Provider Last Rate Last Dose  . 0.9 %  sodium chloride infusion  500 mL Intravenous Once Milus Banister, MD         Past Medical History:  Diagnosis Date  . Allergy   . Depression   . Leukocytosis    reports this way for as long as she can remember  . Lumbar degenerative disc disease 10/10/2012  . Migraine   . No blood products 11/24/2014   Jehovah's witness     Past Surgical History:  Procedure Laterality Date  . ABDOMINAL HYSTERECTOMY     oophorectomy, endometriosis  . COLONOSCOPY  2014    No Known Allergies  Family History  Problem Relation Age of Onset  . Cancer Mother   . Diabetes Father   . Mental illness Sister        anxiety  . Mental illness Sister        ADHD  . Colon cancer Neg Hx   . Esophageal cancer Neg Hx   . Rectal  cancer Neg Hx   . Stomach cancer Neg Hx     Social History   Socioeconomic History  . Marital status: Married    Spouse name: Not on file  . Number of children: Not on file  . Years of education: Not on file  . Highest education level: Not on file  Occupational History  . Not on file  Social Needs  . Financial resource strain: Not on file  . Food insecurity    Worry: Not on file    Inability: Not on file  . Transportation needs    Medical: Not on file     Non-medical: Not on file  Tobacco Use  . Smoking status: Never Smoker  . Smokeless tobacco: Never Used  Substance and Sexual Activity  . Alcohol use: Yes    Alcohol/week: 5.0 standard drinks    Types: 5 Glasses of wine per week  . Drug use: No  . Sexual activity: Yes  Lifestyle  . Physical activity    Days per week: Not on file    Minutes per session: Not on file  . Stress: Not on file  Relationships  . Social Herbalist on phone: Not on file    Gets together: Not on file    Attends religious service: Not on file    Active member of club or organization: Not on file    Attends meetings of clubs or organizations: Not on file    Relationship status: Not on file  Other Topics Concern  . 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   Wt Readings from Last 3 Encounters:  01/07/19 140 lb 2 oz (63.6 kg)  08/08/18 144 lb (65.3 kg)  04/03/18 144 lb 6 oz (65.5 kg)    Physical Exam  Nursing note and vitals reviewed. Constitutional: She is oriented to person, place, and time. She appears well-developed and well-nourished. No distress.  HENT:  Head: Normocephalic and atraumatic.  Right Ear: Hearing, tympanic membrane, external ear and ear canal normal.  Left Ear: Hearing, tympanic membrane, external ear and ear canal normal.  Mouth/Throat: Uvula is midline, oropharynx is clear and moist and mucous membranes are normal.  Eyes: Pupils are equal, round, and reactive to light. Conjunctivae and EOM are normal.  Neck: No tracheal deviation present. No thyromegaly present.  Cardiovascular: Normal rate and regular rhythm.  No murmur  heard. Pulses:      Dorsalis pedis pulses are 2+ on the right side, and 2+ on the left side.  Respiratory: Effort normal and breath sounds normal. No respiratory distress.  GI: Soft. She exhibits no mass. There is no hepatomegaly. There is no tenderness.  Genitourinary:Comments: Breast with no masses, skin changes, or nipple discharge bilateral.  Mild fibrocystic-like changes, mainly in left breast. Musculoskeletal: She exhibits no edema.  No major deformity or signs of synovitis appreciated.  Lymphadenopathy:    She has no cervical adenopathy.       Right: No supraclavicular adenopathy present.  Left: No supraclavicular adenopathy present.  Neurological: She is alert and oriented to person, place, and time. She has normal strength. No cranial nerve deficit. Coordination and gait normal.  Reflex Scores:      Bicep reflexes are 2+ on the right side and 2+ on the left side.      Patellar reflexes are 2+ on the right side and 2+ on the left side. Skin: Skin is warm. No rash noted. No erythema.  Psychiatric: She has a normal mood and affect. Cognitive function grossly intact. Well groomed, good eye contact.    ASSESSMENT AND PLAN:  Ms. Udell Blasingame was here today annual physical examination.   Orders Placed This Encounter  Procedures  . Lipid panel  . Comprehensive metabolic panel   Lab Results  Component Value Date   CHOL 224 (H) 01/07/2019   HDL 85.80 01/07/2019   LDLCALC 127 (H) 01/07/2019   LDLDIRECT 106.3 07/12/2011   TRIG 53.0 01/07/2019   CHOLHDL 3 01/07/2019   Lab Results  Component Value Date   ALT 21 01/07/2019   AST 24 01/07/2019   ALKPHOS 55 01/07/2019   BILITOT 0.8 01/07/2019   Lab Results  Component Value Date   CREATININE 0.90 01/07/2019   BUN 20 01/07/2019   NA 136 01/07/2019   K 4.1 01/07/2019   CL 98 01/07/2019   CO2 31 01/07/2019     Routine general medical examination at a health care facility We discussed the importance of regular  physical activity and healthy diet for prevention of chronic illness and/or complications. Preventive guidelines reviewed. Vaccination up to date.  Because hysterectomy due to endometriosis, Pap smear is not longer needed. She is planning on arranging appointment for mammogram.  Ca++ and vit D supplementation recommended. Next CPE in a year.  The 10-year ASCVD risk score Mikey Bussing DC Brooke Bonito., et al., 2013) is: 1.6%   Values used to calculate the score:     Age: 80 years     Sex: Female     Is Non-Hispanic African American: No     Diabetic: No     Tobacco smoker: No     Systolic Blood Pressure: 233 mmHg     Is BP treated: No     HDL Cholesterol: 85.8 mg/dL     Total Cholesterol: 224 mg/dL  Insomnia, unspecified type Well controlled with Clonazepam , no changes in current management. Good sleep hygiene. Side effects of med discussed. 01/10/19 last refill,one left. F/U in 6 months.  Hyperlipidemia, unspecified hyperlipidemia type Continue low fat diet. Further recommendations will be given according to lab results.  -     Lipid panel  Diabetes mellitus screening -     Comprehensive metabolic panel  Depression, major, in remission ([place]) Well controlled. No changes in current management.   Return in 6 months (on 07/10/2019) for f/u.  Myles Mallicoat G. Martinique, MD  [facility]. [place] office.

## 2019-01-07 NOTE — Patient Instructions (Signed)
A few things to remember from today's visit:   Routine general medical examination at a health care facility  Insomnia, unspecified type  Hyperlipidemia, unspecified hyperlipidemia type - Plan: Lipid panel  Diabetes mellitus screening - Plan: Comprehensive metabolic panel  Depression, major, in remission ([place])  Screening for endocrine, metabolic and immunity disorder  Today you have you routine preventive visit.  At least 150 minutes of moderate exercise per week, daily brisk walking for 15-30 min is a good exercise option. Healthy diet low in saturated (animal) fats and sweets and consisting of fresh fruits and vegetables, lean meats such as fish and white chicken and whole grains.  These are some of recommendations for screening depending of age and risk factors:   - Vaccines:  Tdap vaccine every 10 years.  Shingles vaccine recommended at age 64, could be given after 58 years of age but not sure about insurance coverage.   Pneumonia vaccines:  Prevnar 13 at 65 and Pneumovax at 46. Sometimes Pneumovax is giving earlier if history of smoking, lung disease,diabetes,kidney disease among some.    Screening for diabetes at age 37 and every 3 years.  -Breast cancer: Mammogram: There is disagreement between experts about when to start screening in low risk asymptomatic female but recent recommendations are to start screening at 74 and not later than 58 years old , every 1-2 years and after 58 yo q 2 years. Screening is recommended until 58 years old but some women can continue screening depending of healthy issues.   Colon cancer screening: starts at 58 years old until 58 years old. Also recommended:  1. Dental visit- Brush and floss your teeth twice daily; visit your dentist twice a year. 2. Eye doctor- Get an eye exam at least every 2 years. 3. Helmet use- Always wear a helmet when riding a bicycle, motorcycle, rollerblading or skateboarding. 4. Safe sex- If you may be exposed  to sexually transmitted infections, use a condom. 5. Seat belts- Seat belts can save your live; always wear one. 6. Smoke/Carbon Monoxide detectors- These detectors need to be installed on the appropriate level of your home. Replace batteries at least once a year. 7. Skin cancer- When out in the sun please cover up and use sunscreen 15 SPF or higher. 8. Violence- If anyone is threatening or hurting you, please tell your healthcare provider.  9. Drink alcohol in moderation- Limit alcohol intake to one drink or less per day. Never drink and drive.  Please be sure medication list is accurate. If a new problem present, please set up appointment sooner than planned today.

## 2019-01-12 ENCOUNTER — Encounter: Payer: Self-pay | Admitting: Family Medicine

## 2019-02-21 ENCOUNTER — Other Ambulatory Visit: Payer: Self-pay | Admitting: Family Medicine

## 2019-02-21 DIAGNOSIS — G4700 Insomnia, unspecified: Secondary | ICD-10-CM

## 2019-02-24 ENCOUNTER — Other Ambulatory Visit: Payer: Self-pay | Admitting: Family Medicine

## 2019-02-24 DIAGNOSIS — G4700 Insomnia, unspecified: Secondary | ICD-10-CM

## 2019-02-24 MED ORDER — CLONAZEPAM 0.5 MG PO TABS: ORAL_TABLET | ORAL | 3 refills | Status: DC

## 2019-07-11 ENCOUNTER — Encounter: Payer: Self-pay | Admitting: Family Medicine

## 2019-07-11 ENCOUNTER — Other Ambulatory Visit: Payer: Self-pay | Admitting: Family Medicine

## 2019-07-11 DIAGNOSIS — G4700 Insomnia, unspecified: Secondary | ICD-10-CM

## 2019-07-11 NOTE — Telephone Encounter (Signed)
Last filled 03/31/20 

## 2019-08-14 ENCOUNTER — Ambulatory Visit: Payer: 59 | Attending: Internal Medicine

## 2019-08-14 DIAGNOSIS — Z23 Encounter for immunization: Secondary | ICD-10-CM

## 2019-08-14 NOTE — Progress Notes (Signed)
   Covid-19 Vaccination Clinic  Name:  Carmen Allen    MRN: [id number] DOB: 1960-11-12  08/14/2019  [name] was observed post Covid-19 immunization for 15 minutes without incident. She was provided with Vaccine Information Sheet and instruction to access the V-Safe system.   [name] was instructed to call 911 with any severe reactions post vaccine: Marland Kitchen Difficulty breathing  . Swelling of face and throat  . A fast heartbeat  . A bad rash all over body  . Dizziness and weakness   Immunizations Administered    Name Date Dose VIS Date Route   Pfizer COVID-19 Vaccine 08/14/2019  4:41 PM 0.3 mL 05/09/2019 Intramuscular   Manufacturer: [place]   Lot: [id number]   [place]: [id number]

## 2019-09-09 ENCOUNTER — Ambulatory Visit: Payer: 59 | Attending: Internal Medicine

## 2019-09-09 DIAGNOSIS — Z23 Encounter for immunization: Secondary | ICD-10-CM

## 2019-09-09 NOTE — Progress Notes (Signed)
   Covid-19 Vaccination Clinic  Name:  Carmen Allen    MRN: [id number] DOB: 1961-02-10  09/09/2019  [name] was observed post Covid-19 immunization for 15 minutes without incident. She was provided with Vaccine Information Sheet and instruction to access the V-Safe system.   [name] was instructed to call 911 with any severe reactions post vaccine: Marland Kitchen Difficulty breathing  . Swelling of face and throat  . A fast heartbeat  . A bad rash all over body  . Dizziness and weakness   Immunizations Administered    Name Date Dose VIS Date Route   Pfizer COVID-19 Vaccine 09/09/2019  8:36 AM 0.3 mL 05/09/2019 Intramuscular   Manufacturer: [place]   Lot: [id number]   [place]: [id number]

## 2019-09-29 ENCOUNTER — Encounter: Payer: Self-pay | Admitting: Family Medicine

## 2019-10-01 ENCOUNTER — Encounter: Payer: Self-pay | Admitting: Family Medicine

## 2019-10-01 ENCOUNTER — Telehealth (INDEPENDENT_AMBULATORY_CARE_PROVIDER_SITE_OTHER): Payer: 59 | Admitting: Family Medicine

## 2019-10-01 VITALS — Ht 67.0 in

## 2019-10-01 DIAGNOSIS — G4700 Insomnia, unspecified: Secondary | ICD-10-CM | POA: Diagnosis not present

## 2019-10-01 DIAGNOSIS — F325 Major depressive disorder, single episode, in full remission: Secondary | ICD-10-CM

## 2019-10-01 MED ORDER — CLONAZEPAM 0.5 MG PO TABS: 0.2500 mg | ORAL_TABLET | Freq: Every evening | ORAL | 3 refills | Status: DC | PRN

## 2019-10-01 MED ORDER — IMIPRAMINE HCL 50 MG PO TABS: 100.0000 mg | ORAL_TABLET | Freq: Every day | ORAL | 2 refills | Status: DC

## 2019-10-01 NOTE — Assessment & Plan Note (Signed)
Problem is well controlled. Continue imipramine 50 mg 2 tablets daily at bedtime.

## 2019-10-01 NOTE — Assessment & Plan Note (Signed)
Otherwise problem is well controlled. Good sleep hygiene is still recommended. Continue clonazepam 0.5 mg 1/2 to 1 tablet daily, we discussed some side effects, including addiction. Follow-up in 5 months, before if needed.

## 2019-10-01 NOTE — Progress Notes (Signed)
Virtual Visit via Video Note   I connected with Ms Arboleda on 10/01/19 by a video enabled telemedicine application and verified that I am speaking with the correct person using two identifiers.  Location patient: home Location provider:work office Persons participating in the virtual visit: patient, provider  I discussed the limitations of evaluation and management by telemedicine and the availability of in person appointments. The patient expressed understanding and agreed to proceed.  Chief Complaint  Patient presents with  . Medication Refill    HPI: Ms Pierro is a 59 yo female following on some chronic medical problems. She was last seen on 01/07/19. No new problems sine her last visit. Insomnia and anxiety: She is on Clonazepam 0.5 mg at bedtimes as needed. She takes medications daily. If she takes 1/2 tab she wakes up a couple times and feels "foggy" the next day. Sleeps about 7 hours. Denies side effects.  When she takes the whole tab she sleeps thought the night.  Depression: She is on Imipramine 50 mg 2 tabs at bedtime. She has been on this med for may years. Negative for depressed mood or suicidal thoughts.  ROS: See pertinent positives and negatives per HPI.  Past Medical History:  Diagnosis Date  . Allergy   . Depression   . Leukocytosis    reports this way for as long as she can remember  . Lumbar degenerative disc disease 10/10/2012  . Migraine   . No blood products 11/24/2014   Jehovah's witness     Past Surgical History:  Procedure Laterality Date  . ABDOMINAL HYSTERECTOMY     oophorectomy, endometriosis  . COLONOSCOPY  2014    Family History  Problem Relation Age of Onset  . Cancer Mother   . Diabetes Father   . Mental illness Sister        anxiety  . Mental illness Sister        ADHD  . Colon cancer Neg Hx   . Esophageal cancer Neg Hx   . Rectal cancer Neg Hx   . Stomach cancer Neg Hx     Social History   Socioeconomic History  .  Marital status: Married    Spouse name: Not on file  . Number of children: Not on file  . Years of education: Not on file  . Highest education level: Not on file  Occupational History  . Not on file  Tobacco Use  . Smoking status: Never Smoker  . Smokeless tobacco: Never Used  Substance and Sexual Activity  . Alcohol use: Yes    Alcohol/week: 5.0 standard drinks    Types: 5 Glasses of wine per week  . Drug use: No  . Sexual activity: Yes  Other Topics Concern  . 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 Difficulty of Paying Living Expenses:   Food Insecurity:   . Worried About Charity fundraiser in the Last Year:   . Arboriculturist in the Last Year:   Transportation Needs:   . Film/video editor (Medical):   Marland Kitchen Lack of Transportation (Non-Medical):   Physical Activity:   . Days  of Exercise per Week:   . Minutes of Exercise per Session:   Stress:   . Feeling of Stress :   Social Connections:   . Frequency of Communication with Friends and Family:   . Frequency of Social Gatherings with Friends and Family:   . Attends Religious Services:   . Active Member of Clubs or Organizations:   . Attends Archivist Meetings:   Marland Kitchen Marital Status:   Intimate Partner Violence:   . Fear of Current or Ex-Partner:   . Emotionally Abused:   Marland Kitchen Physically Abused:   . Sexually Abused:       Current Outpatient Medications:  .  clonazePAM (KLONOPIN) 0.5 MG tablet, Take 0.5-1 tablets (0.25-0.5 mg total) by mouth at bedtime as needed for anxiety., Disp: 30 tablet, Rfl: 3 .  fluticasone (FLONASE) 50 MCG/ACT nasal spray, 1 spray up each nostril each bedtime, Disp: , Rfl:  .  imipramine (TOFRANIL) 50  MG tablet, Take 2 tablets (100 mg total) by mouth at bedtime., Disp: 180 tablet, Rfl: 2  Current Facility-Administered Medications:  .  0.9 %  sodium chloride infusion, 500 mL, Intravenous, Once, Milus Banister, MD  EXAM:  VITALS per patient if applicable:  GENERAL: alert, oriented, appears well and in no acute distress  HEENT: atraumatic, conjunctiva clear, no obvious abnormalities on inspection.  NECK: normal movements of the head and neck  LUNGS: on inspection no signs of respiratory distress, breathing rate appears normal, no obvious gross SOB, gasping or wheezing  CV: no obvious cyanosis  PSYCH/NEURO: pleasant and cooperative, no obvious depression or anxiety, speech and thought processing grossly intact  ASSESSMENT AND PLAN:  Discussed the following assessment and plan:  Insomnia, unspecified Otherwise problem is well controlled. Good sleep hygiene is still recommended. Continue clonazepam 0.5 mg 1/2 to 1 tablet daily, we discussed some side effects, including addiction. Follow-up in 5 months, before if needed.  Depression, major, in remission ([place]) Problem is well controlled. Continue imipramine 50 mg 2 tablets daily at bedtime.    I discussed the assessment and treatment plan with the patient. Ms Anhorn was provided an opportunity to ask questions and all were answered. She agreed with the plan and demonstrated an understanding of the instructions.    Return in about 5 months (around 03/02/2020) for cpe.   Lavonda Thal [place], MD

## 2020-02-02 ENCOUNTER — Other Ambulatory Visit: Payer: Self-pay | Admitting: Family Medicine

## 2020-02-02 DIAGNOSIS — G4700 Insomnia, unspecified: Secondary | ICD-10-CM

## 2020-02-03 ENCOUNTER — Other Ambulatory Visit: Payer: Self-pay | Admitting: Family Medicine

## 2020-02-03 ENCOUNTER — Encounter: Payer: Self-pay | Admitting: Family Medicine

## 2020-02-03 DIAGNOSIS — Z1231 Encounter for screening mammogram for malignant neoplasm of breast: Secondary | ICD-10-CM

## 2020-02-13 ENCOUNTER — Other Ambulatory Visit: Payer: Self-pay

## 2020-02-13 ENCOUNTER — Ambulatory Visit
Admission: RE | Admit: 2020-02-13 | Discharge: 2020-02-13 | Disposition: A | Discharge status: Home / Self Care | Payer: 59 | Source: Ambulatory Visit | Attending: Family Medicine | Admitting: Family Medicine

## 2020-02-13 DIAGNOSIS — Z1231 Encounter for screening mammogram for malignant neoplasm of breast: Secondary | ICD-10-CM

## 2020-03-01 ENCOUNTER — Encounter: Payer: Self-pay | Admitting: Family Medicine

## 2020-03-03 ENCOUNTER — Other Ambulatory Visit: Payer: Self-pay | Admitting: Family Medicine

## 2020-03-05 ENCOUNTER — Encounter: Payer: 59 | Admitting: Family Medicine

## 2020-03-26 ENCOUNTER — Ambulatory Visit (INDEPENDENT_AMBULATORY_CARE_PROVIDER_SITE_OTHER): Payer: 59 | Admitting: Family Medicine

## 2020-03-26 ENCOUNTER — Other Ambulatory Visit: Payer: Self-pay

## 2020-03-26 ENCOUNTER — Encounter: Payer: Self-pay | Admitting: Family Medicine

## 2020-03-26 VITALS — BP 100/70 | HR 99 | Resp 16 | Ht 67.0 in | Wt 140.5 lb

## 2020-03-26 DIAGNOSIS — Z0000 Encounter for general adult medical examination without abnormal findings: Secondary | ICD-10-CM

## 2020-03-26 DIAGNOSIS — Z130 Encounter for screening for diseases of the blood and blood-forming organs and certain disorders involving the immune mechanism: Secondary | ICD-10-CM

## 2020-03-26 DIAGNOSIS — Z5181 Encounter for therapeutic drug level monitoring: Secondary | ICD-10-CM

## 2020-03-26 DIAGNOSIS — F325 Major depressive disorder, single episode, in full remission: Secondary | ICD-10-CM

## 2020-03-26 DIAGNOSIS — E785 Hyperlipidemia, unspecified: Secondary | ICD-10-CM | POA: Diagnosis not present

## 2020-03-26 DIAGNOSIS — Z13228 Encounter for screening for other metabolic disorders: Secondary | ICD-10-CM

## 2020-03-26 DIAGNOSIS — G4700 Insomnia, unspecified: Secondary | ICD-10-CM | POA: Diagnosis not present

## 2020-03-26 DIAGNOSIS — Z780 Asymptomatic menopausal state: Secondary | ICD-10-CM | POA: Diagnosis not present

## 2020-03-26 DIAGNOSIS — Z1329 Encounter for screening for other suspected endocrine disorder: Secondary | ICD-10-CM

## 2020-03-26 NOTE — Progress Notes (Signed)
HPI: [name] is a 59 y.o. female, who is here today for her routine physical.  Last CPE: 01/07/19  Regular exercise 3 or more time per week: She is running and spinning class. Following a healthful diet: Yes She lives with her husband.  Chronic medical problems: Depression, insomnia, allergic rhinitis, and hyperlipidemia.  Pap smear: S/P hysterectomy.  Immunization History  Administered Date(s) Administered  . PFIZER SARS-COV-2 Vaccination 08/14/2019, 09/09/2019  . Tdap 10/10/2012   Mammogram: 02/13/2020, Bi-Rads 1, breast density category C.  Family history negative for breast/ovarian cancer. Colonoscopy: 07/23/2017. DEXA: N/A She would like to have DEXA done, she has had it a few years ago and has been normal.  Hep C screening: 02/09/17 NR  She has no new concerns today.  HLD: She is on no pharmacologic treatment.  Lab Results  Component Value Date   CHOL 224 (H) 01/07/2019   HDL 85.80 01/07/2019   LDLCALC 127 (H) 01/07/2019   LDLDIRECT 106.3 07/12/2011   TRIG 53.0 01/07/2019   CHOLHDL 3 01/07/2019   Insomnia: Currently she is on clonazepam 0.5 mg daily at bedtime. Depression: She is on imipramine 50 mg 2 tablets daily at bedtime. Tolerated medication well.  Review of Systems  Constitutional: Negative for appetite change, fatigue and fever.  HENT: Negative for dental problem, hearing loss, mouth sores and sore throat.   Eyes: Negative for redness and visual disturbance.  Respiratory: Negative for cough, shortness of breath and wheezing.   Cardiovascular: Negative for chest pain and leg swelling.  Gastrointestinal: Negative for abdominal pain, nausea and vomiting.       No changes in bowel habits.  Endocrine: Negative for cold intolerance, heat intolerance, polydipsia, polyphagia and polyuria.  Genitourinary: Negative for decreased urine volume, dysuria, hematuria, vaginal bleeding and vaginal discharge.  Musculoskeletal: Negative for gait problem and  myalgias.  Skin: Negative for color change and rash.  Allergic/Immunologic: Positive for environmental allergies.  Neurological: Negative for syncope, weakness and headaches.  Hematological: Negative for adenopathy. Does not bruise/bleed easily.  Psychiatric/Behavioral: Positive for sleep disturbance. Negative for confusion. The patient is not nervous/anxious.   All other systems reviewed and are negative.  Current Outpatient Medications on File Prior to Visit  Medication Sig Dispense Refill  . clonazePAM (KLONOPIN) 0.5 MG tablet TAKE 1/2 TO 1 (ONE-HALF TO ONE) TABLET BY MOUTH AT BEDTIME AS NEEDED FOR ANXIETY 30 tablet 3  . fluticasone (FLONASE) 50 MCG/ACT nasal spray 1 spray up each nostril each bedtime    . imipramine (TOFRANIL) 50 MG tablet Take 2 tablets (100 mg total) by mouth at bedtime. 180 tablet 2   Current Facility-Administered Medications on File Prior to Visit  Medication Dose Route Frequency Provider Last Rate Last Admin  . 0.9 %  sodium chloride infusion  500 mL Intravenous Once Milus Banister, MD       Past Medical History:  Diagnosis Date  . Allergy   . Depression   . Leukocytosis    reports this way for as long as she can remember  . Lumbar degenerative disc disease 10/10/2012  . Migraine   . No blood products 11/24/2014   Jehovah's witness     Past Surgical History:  Procedure Laterality Date  . ABDOMINAL HYSTERECTOMY     oophorectomy, endometriosis  . COLONOSCOPY  2014    No Known Allergies  Family History  Problem Relation Age of Onset  . Cancer Mother   . Diabetes Father   . Mental illness Sister  anxiety  . Mental illness Sister        ADHD  . Colon cancer Neg Hx   . Esophageal cancer Neg Hx   . Rectal cancer Neg Hx   . Stomach cancer Neg Hx     Social History   Socioeconomic History  . Marital status: Married    Spouse name: Not on file  . Number of children: Not on file  . Years of education: Not on file  . Highest education  level: Not on file  Occupational History  . Not on file  Tobacco Use  . Smoking status: Never Smoker  . Smokeless tobacco: Never Used  Vaping Use  . Vaping Use: Never used  Substance and Sexual Activity  . Alcohol use: Yes    Alcohol/week: 5.0 standard drinks    Types: 5 Glasses of wine per week  . Drug use: No  . Sexual activity: Yes  Other Topics Concern  . 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 Difficulty of Paying Living Expenses: Not on file  Food Insecurity:   . Worried About Charity fundraiser in the Last Year: Not on file  . Ran Out of Food in the Last Year: Not on file  Transportation Needs:   . Lack of Transportation (Medical): Not on file  . Lack of Transportation (Non-Medical): Not on file  Physical Activity:   . Days of Exercise per Week: Not on file  . Minutes of Exercise per Session: Not on file  Stress:   . Feeling of Stress : Not on file  Social Connections:   . Frequency of Communication with Friends and Family: Not on file  . Frequency of Social Gatherings with Friends and Family: Not on file  . Attends Religious Services: Not on file  . Active Member of Clubs or Organizations: Not on file  . Attends Archivist Meetings: Not on file  . Marital Status: Not on file   Vitals:   03/26/20 0704  BP: 100/70  Pulse: 99  Resp: 16  SpO2: 99%   Body mass index is 22.01 kg/m.  Wt Readings from Last 3 Encounters:  03/26/20 140 lb 8 oz (63.7 kg)  01/07/19 140 lb 2 oz (63.6 kg)  08/08/18 144 lb (65.3 kg)   Physical Exam Vitals and nursing note reviewed.  Constitutional:      General: She is not in acute distress.    Appearance: She is well-developed and normal  weight.  HENT:     Head: Normocephalic and atraumatic.     Right Ear: Hearing, tympanic membrane, ear canal and external ear normal.     Left Ear: Hearing, tympanic membrane, ear canal and external ear normal.     Mouth/Throat:     Mouth: Mucous membranes are moist.     Pharynx: Oropharynx is clear. Uvula midline.  Eyes:     Extraocular Movements: Extraocular movements intact.     Conjunctiva/sclera: Conjunctivae normal.     Pupils: Pupils are equal, round, and reactive to light.  Neck:     Thyroid: No thyromegaly.     Trachea: No tracheal deviation.  Cardiovascular:     Rate and Rhythm: Normal rate and regular rhythm.     Pulses:          Dorsalis pedis pulses are 2+ on the right side and 2+ on the left side.     Heart sounds: No murmur heard.   Pulmonary:     Effort: Pulmonary effort is normal. No respiratory distress.     Breath sounds: Normal breath sounds.  Abdominal:     Palpations: Abdomen is soft. There is no hepatomegaly or mass.     Tenderness: There is no abdominal tenderness.  Genitourinary:    Comments: Breast: No nipple discharge, skin changes, or masses appreciated, bilateral.  Fibrocystic-like upper-outer quadrant, L>R. Musculoskeletal:     Comments: No major signs of synovitis appreciated.  Lymphadenopathy:     Cervical: No cervical adenopathy.     Upper Body:     Right upper body: No supraclavicular or axillary adenopathy.     Left upper body: No supraclavicular or axillary adenopathy.  Skin:    General: Skin is warm.     Findings: No erythema or rash.  Neurological:     General: No focal deficit present.     Mental Status: She is alert and oriented to person, place, and time.     Cranial Nerves: No cranial nerve deficit.     Coordination: Coordination normal.     Gait: Gait normal.     Deep Tendon Reflexes:     Reflex Scores:      Bicep reflexes are 2+ on the right side and 2+ on the left side.      Patellar reflexes are 2+ on the right side and 2+  on the left side. Psychiatric:        Mood and Affect: Mood and affect normal.     Comments: Well groomed, good eye contact.   ASSESSMENT AND PLAN:  [name] was here today annual physical examination.  Orders Placed This Encounter  Procedures  . DEXAScan  . DRUG MONITORING, PANEL 8 WITH CONFIRMATION, URINE  . COMPLETE METABOLIC PANEL WITH GFR  . Lipid panel    Lab Results  Component Value Date   CREATININE 0.98 03/26/2020   BUN 22 03/26/2020   NA 139 03/26/2020   K 4.6 03/26/2020   CL 100 03/26/2020   CO2 29 03/26/2020   Lab Results  Component Value Date   ALT 16 03/26/2020   AST 23 03/26/2020   ALKPHOS 55 01/07/2019   BILITOT 0.7 03/26/2020   Lab Results  Component Value Date   CHOL 213 (H) 03/26/2020   HDL 92 03/26/2020   LDLCALC 103 (H) 03/26/2020   LDLDIRECT 106.3 07/12/2011   TRIG 86 03/26/2020   CHOLHDL 2.3 03/26/2020    Routine general medical examination at a health care facility We discussed the importance of regular physical activity and healthy diet for prevention of chronic illness and/or complications. Preventive guidelines reviewed. Vaccination up-to-date. Ca++ and vit D supplementation recommended. Next CPE in a year.  The 10-year ASCVD risk score Mikey Bussing DC Brooke Bonito., et al., 2013) is: 1.3%   Values used to calculate the score:     Age: 63 years     Sex: Female     Is Non-Hispanic African American: No     Diabetic: No     Tobacco smoker: No     Systolic Blood Pressure: 097 mmHg     Is BP treated: No     HDL Cholesterol: 92 mg/dL  Total Cholesterol: 213 mg/dL  Hyperlipidemia, unspecified hyperlipidemia type Continue nonpharmacologic treatment. Further recommendation will be given according to lipid panel results and 10-year CVD risk score.  Insomnia, unspecified type Problem is well controlled. Continue clonazepam 0.5 mg daily at bedtime as needed.  Asymptomatic postmenopausal estrogen deficiency -     DEXAScan;  Future  Screening for endocrine, metabolic and immunity disorder -     COMPLETE METABOLIC PANEL WITH GFR  Encounter for medication monitoring -     DRUG MONITORING, PANEL 8 WITH CONFIRMATION, URINE  Depression, major, in remission ([place]) Problem is well controlled. Continue imipramine 50 mg 2 tablets daily at bedtime.  Return in 6 months (on 09/24/2020) for med f/u.  Carmen Mceachron G. Martin[place], MD  [facility]. [place] office.   Today you have you routine preventive visit. A few things to remember from today's visit:   Routine general medical examination at a health care facility  Hyperlipidemia, unspecified hyperlipidemia type - Plan: Lipid panel  Insomnia, unspecified type  Asymptomatic postmenopausal estrogen deficiency - Plan: DEXAScan  Screening for endocrine, metabolic and immunity disorder - Plan: COMPLETE METABOLIC PANEL WITH GFR  Encounter for medication monitoring - Plan: DRUG MONITORING, PANEL 8 WITH CONFIRMATION, URINE  If you need refills please call your pharmacy. Do not use My Chart to request refills or for acute issues that need immediate attention.   Please be sure medication list is accurate. If a new problem present, please set up appointment sooner than planned today.  At least 150 minutes of moderate exercise per week, daily brisk walking for 15-30 min is a good exercise option. Healthy diet low in saturated (animal) fats and sweets and consisting of fresh fruits and vegetables, lean meats such as fish and white chicken and whole grains.  These are some of recommendations for screening depending of age and risk factors:  - Vaccines:  Tdap vaccine every 10 years.  Shingles vaccine recommended at age 72, could be given after 59 years of age but not sure about insurance coverage.   Pneumonia vaccines: Pneumovax at 103. Sometimes Pneumovax is giving earlier if history of smoking, lung disease,diabetes,kidney disease among some.  Screening for  diabetes at age 90 and every 3 years.  Cervical cancer prevention:  Pap smear starts at 59 years of age and continues periodically until 59 years old in low risk women. Pap smear every 3 years between 30 and 43 years old. Pap smear every 3-5 years between women 39 and older if pap smear negative and HPV screening negative.   -Breast cancer: Mammogram: There is disagreement between experts about when to start screening in low risk asymptomatic female but recent recommendations are to start screening at 37 and not later than 59 years old , every 1-2 years and after 59 yo q 2 years. Screening is recommended until 59 years old but some women can continue screening depending of healthy issues.  Colon cancer screening: Has been recently changed to 59 yo. Insurance may not cover until you are 59 years old. Screening is recommended until 59 years old.  Cholesterol disorder screening at age 68 and every 3 years. N/A  Also recommended:  1. Dental visit- Brush and floss your teeth twice daily; visit your dentist twice a year. 2. Eye doctor- Get an eye exam at least every 2 years. 3. Helmet use- Always wear a helmet when riding a bicycle, motorcycle, rollerblading or skateboarding. 4. Safe sex- If you may be exposed to sexually transmitted infections, use a  condom. 5. Seat belts- Seat belts can save your live; always wear one. 6. Smoke/Carbon Monoxide detectors- These detectors need to be installed on the appropriate level of your home. Replace batteries at least once a year. 7. Skin cancer- When out in the sun please cover up and use sunscreen 15 SPF or higher. 8. Violence- If anyone is threatening or hurting you, please tell your healthcare provider.  9. Drink alcohol in moderation- Limit alcohol intake to one drink or less per day. Never drink and drive. 10. Calcium supplementation 1000 to 1200 mg daily, ideally through your diet.  Vitamin D supplementation 800 units daily.

## 2020-03-26 NOTE — Patient Instructions (Signed)
Today you have you routine preventive visit. A few things to remember from today's visit:   Routine general medical examination at a health care facility  Hyperlipidemia, unspecified hyperlipidemia type - Plan: Lipid panel  Insomnia, unspecified type  Asymptomatic postmenopausal estrogen deficiency - Plan: DEXAScan  Screening for endocrine, metabolic and immunity disorder - Plan: COMPLETE METABOLIC PANEL WITH GFR  Encounter for medication monitoring - Plan: DRUG MONITORING, PANEL 8 WITH CONFIRMATION, URINE  If you need refills please call your pharmacy. Do not use My Chart to request refills or for acute issues that need immediate attention.   Please be sure medication list is accurate. If a new problem present, please set up appointment sooner than planned today.  At least 150 minutes of moderate exercise per week, daily brisk walking for 15-30 min is a good exercise option. Healthy diet low in saturated (animal) fats and sweets and consisting of fresh fruits and vegetables, lean meats such as fish and white chicken and whole grains.  These are some of recommendations for screening depending of age and risk factors:  - Vaccines:  Tdap vaccine every 10 years.  Shingles vaccine recommended at age 62, could be given after 59 years of age but not sure about insurance coverage.   Pneumonia vaccines: Pneumovax at 76. Sometimes Pneumovax is giving earlier if history of smoking, lung disease,diabetes,kidney disease among some.  Screening for diabetes at age 46 and every 3 years.  Cervical cancer prevention:  Pap smear starts at 59 years of age and continues periodically until 59 years old in low risk women. Pap smear every 3 years between 99 and 23 years old. Pap smear every 3-5 years between women 68 and older if pap smear negative and HPV screening negative.   -Breast cancer: Mammogram: There is disagreement between experts about when to start screening in low risk asymptomatic  female but recent recommendations are to start screening at 38 and not later than 59 years old , every 1-2 years and after 59 yo q 2 years. Screening is recommended until 59 years old but some women can continue screening depending of healthy issues.  Colon cancer screening: Has been recently changed to 59 yo. Insurance may not cover until you are 59 years old. Screening is recommended until 59 years old.  Cholesterol disorder screening at age 19 and every 3 years. N/A  Also recommended:  1. Dental visit- Brush and floss your teeth twice daily; visit your dentist twice a year. 2. Eye doctor- Get an eye exam at least every 2 years. 3. Helmet use- Always wear a helmet when riding a bicycle, motorcycle, rollerblading or skateboarding. 4. Safe sex- If you may be exposed to sexually transmitted infections, use a condom. 5. Seat belts- Seat belts can save your live; always wear one. 6. Smoke/Carbon Monoxide detectors- These detectors need to be installed on the appropriate level of your home. Replace batteries at least once a year. 7. Skin cancer- When out in the sun please cover up and use sunscreen 15 SPF or higher. 8. Violence- If anyone is threatening or hurting you, please tell your healthcare provider.  9. Drink alcohol in moderation- Limit alcohol intake to one drink or less per day. Never drink and drive. 10. Calcium supplementation 1000 to 1200 mg daily, ideally through your diet.  Vitamin D supplementation 800 units daily.

## 2020-03-28 LAB — LIPID PANEL
Cholesterol: 213 mg/dL — ABNORMAL HIGH (ref <200)
HDL: 92 mg/dL (ref >=50)
LDL Cholesterol (Calc): 103 mg/dL (calc) — ABNORMAL HIGH
Non-HDL Cholesterol (Calc): 121 mg/dL (calc) (ref <130)
Total CHOL/HDL Ratio: 2.3 (calc) (ref <5.0)
Triglycerides: 86 mg/dL (ref <150)

## 2020-03-28 LAB — COMPLETE METABOLIC PANEL WITH GFR
AG Ratio: 2.1 (calc) (ref 1.0–2.5)
ALT: 16 U/L (ref 6–29)
AST: 23 U/L (ref 10–35)
Albumin: 4.4 g/dL (ref 3.6–5.1)
Alkaline phosphatase (APISO): 44 U/L (ref 37–153)
BUN: 22 mg/dL (ref 7–25)
CO2: 29 mmol/L (ref 20–32)
Calcium: 9.6 mg/dL (ref 8.6–10.4)
Chloride: 100 mmol/L (ref 98–110)
Creat: 0.98 mg/dL (ref 0.50–1.05)
GFR, Est African American: 73 mL/min/{1.73_m2} (ref >=60)
GFR, Est Non African American: 63 mL/min/{1.73_m2} (ref >=60)
Globulin: 2.1 g/dL (calc) (ref 1.9–3.7)
Glucose, Bld: 100 mg/dL — ABNORMAL HIGH (ref 65–99)
Potassium: 4.6 mmol/L (ref 3.5–5.3)
Sodium: 139 mmol/L (ref 135–146)
Total Bilirubin: 0.7 mg/dL (ref 0.2–1.2)
Total Protein: 6.5 g/dL (ref 6.1–8.1)

## 2020-03-28 LAB — DRUG MONITORING, PANEL 8 WITH CONFIRMATION, URINE
6 Acetylmorphine: NEGATIVE ng/mL (ref <10)
Alcohol Metabolites: POSITIVE ng/mL — AB
Alphahydroxyalprazolam: NEGATIVE ng/mL (ref <25)
Alphahydroxymidazolam: NEGATIVE ng/mL (ref <50)
Alphahydroxytriazolam: NEGATIVE ng/mL (ref <50)
Aminoclonazepam: 182 ng/mL — ABNORMAL HIGH (ref <25)
Amphetamines: NEGATIVE ng/mL (ref <500)
Benzodiazepines: POSITIVE ng/mL — AB (ref <100)
Buprenorphine, Urine: NEGATIVE ng/mL (ref <5)
Cocaine Metabolite: NEGATIVE ng/mL (ref <150)
Creatinine: 113.6 mg/dL
Ethyl Glucuronide (ETG): 34809 ng/mL — ABNORMAL HIGH (ref <500)
Ethyl Sulfate (ETS): 10007 ng/mL — ABNORMAL HIGH (ref <100)
Hydroxyethylflurazepam: NEGATIVE ng/mL (ref <50)
Lorazepam: NEGATIVE ng/mL (ref <50)
MDMA: NEGATIVE ng/mL (ref <500)
Marijuana Metabolite: NEGATIVE ng/mL (ref <20)
Nordiazepam: NEGATIVE ng/mL (ref <50)
Opiates: NEGATIVE ng/mL (ref <100)
Oxazepam: NEGATIVE ng/mL (ref <50)
Oxidant: NEGATIVE ug/mL
Oxycodone: NEGATIVE ng/mL (ref <100)
Temazepam: NEGATIVE ng/mL (ref <50)
pH: 5.8 (ref 4.5–9.0)

## 2020-03-28 LAB — DM TEMPLATE

## 2020-04-27 ENCOUNTER — Encounter: Payer: 59 | Admitting: Family Medicine

## 2020-05-27 ENCOUNTER — Encounter: Payer: Self-pay | Admitting: Family Medicine

## 2020-05-31 ENCOUNTER — Other Ambulatory Visit: Payer: Self-pay | Admitting: Family Medicine

## 2020-05-31 DIAGNOSIS — G4700 Insomnia, unspecified: Secondary | ICD-10-CM

## 2020-06-02 ENCOUNTER — Other Ambulatory Visit: Payer: Self-pay | Admitting: Family Medicine

## 2020-06-02 DIAGNOSIS — G4700 Insomnia, unspecified: Secondary | ICD-10-CM

## 2020-06-02 MED ORDER — CLONAZEPAM 0.5 MG PO TABS: ORAL_TABLET | ORAL | 3 refills | Status: DC

## 2020-06-28 ENCOUNTER — Other Ambulatory Visit: Payer: Self-pay

## 2020-06-29 ENCOUNTER — Encounter: Payer: Self-pay | Admitting: Internal Medicine

## 2020-06-29 ENCOUNTER — Ambulatory Visit (INDEPENDENT_AMBULATORY_CARE_PROVIDER_SITE_OTHER): Payer: 59 | Admitting: Internal Medicine

## 2020-06-29 VITALS — BP 112/72 | HR 84 | Temp 98.3°F | Ht 67.0 in | Wt 146.0 lb

## 2020-06-29 DIAGNOSIS — Z0001 Encounter for general adult medical examination with abnormal findings: Secondary | ICD-10-CM | POA: Insufficient documentation

## 2020-06-29 DIAGNOSIS — G4700 Insomnia, unspecified: Secondary | ICD-10-CM

## 2020-06-29 DIAGNOSIS — E785 Hyperlipidemia, unspecified: Secondary | ICD-10-CM | POA: Diagnosis not present

## 2020-06-29 DIAGNOSIS — Z0000 Encounter for general adult medical examination without abnormal findings: Secondary | ICD-10-CM

## 2020-06-29 DIAGNOSIS — F325 Major depressive disorder, single episode, in full remission: Secondary | ICD-10-CM

## 2020-06-29 DIAGNOSIS — F419 Anxiety disorder, unspecified: Secondary | ICD-10-CM

## 2020-06-29 DIAGNOSIS — Z86010 Personal history of colonic polyps: Secondary | ICD-10-CM

## 2020-06-29 DIAGNOSIS — R739 Hyperglycemia, unspecified: Secondary | ICD-10-CM

## 2020-06-29 DIAGNOSIS — E559 Vitamin D deficiency, unspecified: Secondary | ICD-10-CM

## 2020-06-29 DIAGNOSIS — E538 Deficiency of other specified B group vitamins: Secondary | ICD-10-CM

## 2020-06-29 MED ORDER — IMIPRAMINE HCL 50 MG PO TABS: 100.0000 mg | ORAL_TABLET | Freq: Every day | ORAL | 2 refills | Status: DC

## 2020-06-29 MED ORDER — CLONAZEPAM 0.5 MG PO TABS: ORAL_TABLET | ORAL | 3 refills | Status: DC

## 2020-06-29 NOTE — Patient Instructions (Addendum)
You will be contacted regarding the referral for: colonoscopy  Your DXA is scheduled for Mar 1 as you mentioned  Please continue all other medications as before, and refills have been done if requested.  Please have the pharmacy call with any other refills you may need.  Please continue your efforts at being more active, low cholesterol diet, and weight control.  You are otherwise up to date with prevention measures today.  Please keep your appointments with your specialists as you may have planned  Please go to the LAB at the blood drawing area for the tests to be done - at the [facility] ([place]) at your [facility] will be contacted by phone if any changes need to be made immediately.  Otherwise, you will receive a letter about your results with an explanation, but please check with MyChart first.  Please remember to sign up for MyChart if you have not done so, as this will be important to you in the future with finding out test results, communicating by private email, and scheduling acute appointments online when needed.  Please make an Appointment to return for your 1 year visit, or sooner if needed, with Lab testing by Appointment as well, to be done about 3-5 days before at the [facility] (so this is for TWO appointments - please see the scheduling desk as you leave)  Due to the ongoing Covid 19 pandemic, our lab now requires an appointment for any labs done at our office.  If you need labs done and do not have an appointment, please call our office ahead of time to schedule before presenting to the lab for your testing.

## 2020-06-29 NOTE — Progress Notes (Deleted)
Established Patient Office Visit  Subjective:  Patient ID: Carmen Allen, female    DOB: [birth year]  Age: 60 y.o. MRN: [id number]    Had HRT after TAH BSO at 60yo.   CC:  Chief Complaint  Patient presents with  . Transitions Of Care    HPI Corrina Allen presents for ***  Past Medical History:  Diagnosis Date  . Allergy   . Depression   . Leukocytosis    reports this way for as long as she can remember  . Lumbar degenerative disc disease 10/10/2012  . Migraine   . No blood products 11/24/2014   Jehovah's witness     Past Surgical History:  Procedure Laterality Date  . ABDOMINAL HYSTERECTOMY     oophorectomy, endometriosis  . COLONOSCOPY  2014    Family History  Problem Relation Age of Onset  . Cancer Mother   . Diabetes Father   . Mental illness Sister        anxiety  . Mental illness Sister        ADHD  . Colon cancer Neg Hx   . Esophageal cancer Neg Hx   . Rectal cancer Neg Hx   . Stomach cancer Neg Hx     Social History   Socioeconomic History  . Marital status: Married    Spouse name: Not on file  . Number of children: Not on file  . Years of education: Not on file  . Highest education level: Not on file  Occupational History  . Not on file  Tobacco Use  . Smoking status: Never Smoker  . Smokeless tobacco: Never Used  Vaping Use  . Vaping Use: Never used  Substance and Sexual Activity  . Alcohol use: Yes    Alcohol/week: 5.0 standard drinks    Types: 5 Glasses of wine per week  . Drug use: No  . Sexual activity: Yes  Other Topics Concern  . 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 clonazePAM (KLONOPIN) 0.5 MG tablet TAKE 1/2 TO 1 (ONE-HALF TO ONE) TABLET BY MOUTH AT BEDTIME AS NEEDED FOR ANXIETY 30 tablet 3  . fluticasone (FLONASE) 50 MCG/ACT nasal spray 1 spray up each nostril each bedtime    . imipramine (TOFRANIL) 50 MG tablet Take 2 tablets (100 mg total) by mouth at bedtime. 180 tablet 2   Facility-Administered Medications Prior to Visit  Medication Dose Route Frequency Provider Last Rate Last Admin  . 0.9 %  sodium chloride infusion  500 mL Intravenous Once Milus Banister, MD        No Known Allergies  ROS Review of Systems    Objective:    Physical Exam  BP 112/72   Pulse 84   Temp 98.3 F (36.8 C) (Oral)   Ht 5\' 7"  (1.702 m)   Wt 146 lb (  66.2 kg)   SpO2 98%   BMI 22.87 kg/m  Wt Readings from Last 3 Encounters:  06/29/20 146 lb (66.2 kg)  03/26/20 140 lb 8 oz (63.7 kg)  01/07/19 140 lb 2 oz (63.6 kg)     There are no preventive care reminders to display for this patient.  There are no preventive care reminders to display for this patient.  Lab Results  Component Value Date   TSH 2.49 06/06/2017   Lab Results  Component Value Date   WBC 3.8 (L) 11/12/2013   HGB 12.8 11/12/2013   HCT 37.7 11/12/2013   MCV 97.5 11/12/2013   PLT 236.0 11/12/2013   Lab Results  Component Value Date   NA 139 03/26/2020   K 4.6 03/26/2020   CO2 29 03/26/2020   GLUCOSE 100 (H) 03/26/2020   BUN 22 03/26/2020   CREATININE 0.98 03/26/2020   BILITOT 0.7 03/26/2020   ALKPHOS 55 01/07/2019   AST 23 03/26/2020   ALT 16 03/26/2020   PROT 6.5 03/26/2020   ALBUMIN 4.6 01/07/2019   CALCIUM 9.6 03/26/2020   GFR 64.33 01/07/2019   Lab Results  Component Value Date   CHOL 213 (H) 03/26/2020   Lab Results  Component Value Date   HDL 92  03/26/2020   Lab Results  Component Value Date   LDLCALC 103 (H) 03/26/2020   Lab Results  Component Value Date   TRIG 86 03/26/2020   Lab Results  Component Value Date   CHOLHDL 2.3 03/26/2020   Lab Results  Component Value Date   HGBA1C 5.2 11/24/2014      Assessment & Plan:   Problem List Items Addressed This Visit   None     No orders of the defined types were placed in this encounter.   Follow-up: No follow-ups on file.    Cathlean Cower, MD

## 2020-07-05 ENCOUNTER — Encounter: Payer: Self-pay | Admitting: Internal Medicine

## 2020-07-05 NOTE — Assessment & Plan Note (Signed)
Stable, for cont med tx

## 2020-07-05 NOTE — Assessment & Plan Note (Signed)
Stabl, cont current med tx

## 2020-07-05 NOTE — Assessment & Plan Note (Signed)
Stalbe, cont current med tx

## 2020-07-05 NOTE — Progress Notes (Signed)
Patient ID: Carmen Allen, female   DOB: Jul 30, 1960, 60 y.o.   MRN: [id number]         Chief Complaint:: wellness exam and Transitions Of Care  and wellness,        HPI:  Carmen Allen is a 61 y.o. female here for wellness exam, due for colonoscopy, has DXA scheduled for mar 1, no new complaints   Wt Readings from Last 3 Encounters:  06/29/20 146 lb (66.2 kg)  03/26/20 140 lb 8 oz (63.7 kg)  01/07/19 140 lb 2 oz (63.6 kg)   BP Readings from Last 3 Encounters:  06/29/20 112/72  03/26/20 100/70  01/07/19 118/74   Immunization History  Administered Date(s) Administered  . PFIZER(Purple Top)SARS-COV-2 Vaccination 08/14/2019, 09/09/2019, 05/20/2020  . Tdap 10/10/2012  There are no preventive care reminders to display for this patient.     Past Medical History:  Diagnosis Date  . Allergy   . Depression   . Leukocytosis    reports this way for as long as she can remember  . Lumbar degenerative disc disease 10/10/2012  . Migraine   . No blood products 11/24/2014   Jehovah's witness    Past Surgical History:  Procedure Laterality Date  . ABDOMINAL HYSTERECTOMY     oophorectomy, endometriosis  . COLONOSCOPY  2014    reports that she has never smoked. She has never used smokeless tobacco. She reports current alcohol use of about 5.0 standard drinks of alcohol per week. She reports that she does not use drugs. family history includes Cancer in her mother; Diabetes in her father; Mental illness in her sister and sister. No Known Allergies Current Outpatient Medications on File Prior to Visit  Medication Sig Dispense Refill  . fluticasone (FLONASE) 50 MCG/ACT nasal spray 1 spray up each nostril each bedtime     No current facility-administered medications on file prior to visit.        ROS:  All others reviewed and negative.  Objective        PE:  BP 112/72   Pulse 84   Temp 98.3 F (36.8 C) (Oral)   Ht 5\' 7"  (1.702 m)   Wt 146 lb (66.2 kg)   SpO2 98%   BMI 22.87  kg/m                 Constitutional: Pt appears in NAD               HENT: Head: NCAT.                Right Ear: External ear normal.                 Left Ear: External ear normal.                Eyes: . Pupils are equal, round, and reactive to light. Conjunctivae and EOM are normal               Nose: without d/c or deformity               Neck: Neck supple. Gross normal ROM               Cardiovascular: Normal rate and regular rhythm.                 Pulmonary/Chest: Effort normal and breath sounds without rales or wheezing.                Abd:  Soft, NT,  ND, + BS, no organomegaly               Neurological: Pt is alert. At baseline orientation, motor grossly intact               Skin: Skin is warm. No rashes, no other new lesions, LE edema - none               Psychiatric: Pt behavior is normal without agitation   Micro: none  Cardiac tracings I have personally interpreted today:  none  Pertinent Radiological findings (summarize): none   Lab Results  Component Value Date   WBC 3.8 (L) 11/12/2013   HGB 12.8 11/12/2013   HCT 37.7 11/12/2013   PLT 236.0 11/12/2013   GLUCOSE 100 (H) 03/26/2020   CHOL 213 (H) 03/26/2020   TRIG 86 03/26/2020   HDL 92 03/26/2020   LDLDIRECT 106.3 07/12/2011   LDLCALC 103 (H) 03/26/2020   ALT 16 03/26/2020   AST 23 03/26/2020   NA 139 03/26/2020   K 4.6 03/26/2020   CL 100 03/26/2020   CREATININE 0.98 03/26/2020   BUN 22 03/26/2020   CO2 29 03/26/2020   TSH 2.49 06/06/2017   HGBA1C 5.2 11/24/2014   Assessment/Plan:  Kristyna Bradstreet is a 60 y.o. White or Caucasian [1] female with  has a past medical history of Allergy, Depression, Leukocytosis, Lumbar degenerative disc disease (10/10/2012), Migraine, and No blood products (11/24/2014). Preventative health care Age and sex appropriate education and counseling updated with regular exercise and diet Referrals for preventative services - refer colonoscopy, has DXA scheduled Immunizations  addressed - none needed Smoking counseling  - none needed Evidence for depression or other mood disorder - none significant Most recent labs reviewed. I have personally reviewed and have noted: 1) the patient's medical and social history 2) The patient's current medications and supplements 3) The patient's height, weight, and BMI have been recorded in the chart   Anxiety Stalbe, cont current med tx  Depression, major, in remission ([place]) Stabl, cont current med tx  Hyperlipidemia Lab Results  Component Value Date   LDLCALC 103 (H) 03/26/2020   Stable, pt to continue current low chol diet   Insomnia, unspecified Stable, for cont med tx  Followup: Return in about 1 year (around 06/29/2021).  Cathlean Cower, MD 07/05/2020 9:19 PM [facility]

## 2020-07-05 NOTE — Assessment & Plan Note (Signed)
Lab Results  Component Value Date   LDLCALC 103 (H) 03/26/2020   Stable, pt to continue current low chol diet

## 2020-07-05 NOTE — Assessment & Plan Note (Addendum)
Age and sex appropriate education and counseling updated with regular exercise and diet Referrals for preventative services - refer colonoscopy, has DXA scheduled Immunizations addressed - none needed Smoking counseling  - none needed Evidence for depression or other mood disorder - none significant Most recent labs reviewed. I have personally reviewed and have noted: 1) the patient's medical and social history 2) The patient's current medications and supplements 3) The patient's height, weight, and BMI have been recorded in the chart

## 2020-07-12 ENCOUNTER — Encounter: Payer: Self-pay | Admitting: Internal Medicine

## 2020-07-12 DIAGNOSIS — Z0000 Encounter for general adult medical examination without abnormal findings: Secondary | ICD-10-CM

## 2020-07-12 DIAGNOSIS — E785 Hyperlipidemia, unspecified: Secondary | ICD-10-CM

## 2020-07-12 DIAGNOSIS — R739 Hyperglycemia, unspecified: Secondary | ICD-10-CM

## 2020-07-15 ENCOUNTER — Encounter: Payer: Self-pay | Admitting: Gastroenterology

## 2020-07-20 ENCOUNTER — Encounter: Payer: Self-pay | Admitting: Internal Medicine

## 2020-07-20 ENCOUNTER — Other Ambulatory Visit (INDEPENDENT_AMBULATORY_CARE_PROVIDER_SITE_OTHER): Payer: 59

## 2020-07-20 DIAGNOSIS — R739 Hyperglycemia, unspecified: Secondary | ICD-10-CM | POA: Diagnosis not present

## 2020-07-20 DIAGNOSIS — E2839 Other primary ovarian failure: Secondary | ICD-10-CM

## 2020-07-20 DIAGNOSIS — E785 Hyperlipidemia, unspecified: Secondary | ICD-10-CM | POA: Diagnosis not present

## 2020-07-20 DIAGNOSIS — E538 Deficiency of other specified B group vitamins: Secondary | ICD-10-CM | POA: Diagnosis not present

## 2020-07-20 DIAGNOSIS — E559 Vitamin D deficiency, unspecified: Secondary | ICD-10-CM

## 2020-07-20 LAB — URINALYSIS, ROUTINE W REFLEX MICROSCOPIC
Bilirubin Urine: NEGATIVE
Hgb urine dipstick: NEGATIVE
Ketones, ur: NEGATIVE
Leukocytes,Ua: NEGATIVE
Nitrite: NEGATIVE
RBC / HPF: NONE SEEN (ref 0–?)
Specific Gravity, Urine: 1.01 (ref 1.000–1.030)
Total Protein, Urine: NEGATIVE
Urine Glucose: NEGATIVE
Urobilinogen, UA: 0.2 (ref 0.0–1.0)
pH: 6 (ref 5.0–8.0)

## 2020-07-20 LAB — BASIC METABOLIC PANEL
BUN: 13 mg/dL (ref 6–23)
CO2: 29 mEq/L (ref 19–32)
Calcium: 9.5 mg/dL (ref 8.4–10.5)
Chloride: 100 mEq/L (ref 96–112)
Creatinine, Ser: 0.9 mg/dL (ref 0.40–1.20)
GFR: 70.02 mL/min (ref >60.00)
Glucose, Bld: 88 mg/dL (ref 70–99)
Potassium: 4 mEq/L (ref 3.5–5.1)
Sodium: 139 mEq/L (ref 135–145)

## 2020-07-20 LAB — CBC WITH DIFFERENTIAL/PLATELET
Basophils Absolute: 0 10*3/uL (ref 0.0–0.1)
Basophils Relative: 0.4 % (ref 0.0–3.0)
Eosinophils Absolute: 0.1 10*3/uL (ref 0.0–0.7)
Eosinophils Relative: 3.4 % (ref 0.0–5.0)
HCT: 37.5 % (ref 36.0–46.0)
Hemoglobin: 13 g/dL (ref 12.0–15.0)
Lymphocytes Relative: 47.2 % — ABNORMAL HIGH (ref 12.0–46.0)
Lymphs Abs: 1.5 10*3/uL (ref 0.7–4.0)
MCHC: 34.5 g/dL (ref 30.0–36.0)
MCV: 96.4 fl (ref 78.0–100.0)
Monocytes Absolute: 0.3 10*3/uL (ref 0.1–1.0)
Monocytes Relative: 9.6 % (ref 3.0–12.0)
Neutro Abs: 1.2 10*3/uL — ABNORMAL LOW (ref 1.4–7.7)
Neutrophils Relative %: 39.4 % — ABNORMAL LOW (ref 43.0–77.0)
Platelets: 218 10*3/uL (ref 150.0–400.0)
RBC: 3.89 Mil/uL (ref 3.87–5.11)
RDW: 12.3 % (ref 11.5–15.5)
WBC: 3.1 10*3/uL — ABNORMAL LOW (ref 4.0–10.5)

## 2020-07-20 LAB — LIPID PANEL
Cholesterol: 210 mg/dL — ABNORMAL HIGH (ref 0–200)
HDL: 89.4 mg/dL (ref >39.00)
LDL Cholesterol: 108 mg/dL — ABNORMAL HIGH (ref 0–99)
NonHDL: 120.85
Total CHOL/HDL Ratio: 2
Triglycerides: 64 mg/dL (ref 0.0–149.0)
VLDL: 12.8 mg/dL (ref 0.0–40.0)

## 2020-07-20 LAB — HEPATIC FUNCTION PANEL
ALT: 17 U/L (ref 0–35)
AST: 24 U/L (ref 0–37)
Albumin: 4.4 g/dL (ref 3.5–5.2)
Alkaline Phosphatase: 39 U/L (ref 39–117)
Bilirubin, Direct: 0.1 mg/dL (ref 0.0–0.3)
Total Bilirubin: 0.6 mg/dL (ref 0.2–1.2)
Total Protein: 6.9 g/dL (ref 6.0–8.3)

## 2020-07-20 LAB — VITAMIN D 25 HYDROXY (VIT D DEFICIENCY, FRACTURES): VITD: 22.72 ng/mL — ABNORMAL LOW (ref 30.00–100.00)

## 2020-07-20 LAB — TSH: TSH: 2.44 u[IU]/mL (ref 0.35–4.50)

## 2020-07-20 LAB — VITAMIN B12: Vitamin B-12: 232 pg/mL (ref 211–911)

## 2020-07-20 LAB — HEMOGLOBIN A1C: Hgb A1c MFr Bld: 5.3 % (ref 4.6–6.5)

## 2020-07-21 NOTE — Telephone Encounter (Signed)
Staff to assist pt with DXA scheduling at [facility] please

## 2020-07-26 ENCOUNTER — Other Ambulatory Visit: Payer: Self-pay

## 2020-07-26 ENCOUNTER — Ambulatory Visit (INDEPENDENT_AMBULATORY_CARE_PROVIDER_SITE_OTHER)
Admission: RE | Admit: 2020-07-26 | Discharge: 2020-07-26 | Disposition: A | Discharge status: Home / Self Care | Payer: 59 | Source: Ambulatory Visit | Attending: Internal Medicine | Admitting: Internal Medicine

## 2020-07-26 ENCOUNTER — Encounter: Payer: Self-pay | Admitting: Internal Medicine

## 2020-07-26 DIAGNOSIS — E2839 Other primary ovarian failure: Secondary | ICD-10-CM | POA: Diagnosis not present

## 2020-07-27 ENCOUNTER — Other Ambulatory Visit: Payer: 59

## 2020-08-05 ENCOUNTER — Other Ambulatory Visit: Payer: Self-pay

## 2020-08-05 ENCOUNTER — Ambulatory Visit (INDEPENDENT_AMBULATORY_CARE_PROVIDER_SITE_OTHER)
Admission: RE | Admit: 2020-08-05 | Discharge: 2020-08-05 | Disposition: A | Discharge status: Home / Self Care | Payer: Self-pay | Source: Ambulatory Visit | Attending: Internal Medicine | Admitting: Internal Medicine

## 2020-08-05 DIAGNOSIS — R739 Hyperglycemia, unspecified: Secondary | ICD-10-CM

## 2020-08-05 DIAGNOSIS — E785 Hyperlipidemia, unspecified: Secondary | ICD-10-CM

## 2020-08-06 ENCOUNTER — Other Ambulatory Visit: Payer: Self-pay | Admitting: Internal Medicine

## 2020-08-06 ENCOUNTER — Encounter: Payer: Self-pay | Admitting: Internal Medicine

## 2020-08-06 MED ORDER — ATORVASTATIN CALCIUM 10 MG PO TABS: 10.0000 mg | ORAL_TABLET | Freq: Every day | ORAL | 3 refills | Status: DC

## 2020-09-07 ENCOUNTER — Telehealth: Payer: Self-pay | Admitting: *Deleted

## 2020-09-07 ENCOUNTER — Other Ambulatory Visit: Payer: Self-pay

## 2020-09-07 ENCOUNTER — Ambulatory Visit (AMBULATORY_SURGERY_CENTER): Payer: Self-pay | Admitting: *Deleted

## 2020-09-07 VITALS — Ht 67.5 in | Wt 143.0 lb

## 2020-09-07 DIAGNOSIS — Z86010 Personal history of colonic polyps: Secondary | ICD-10-CM

## 2020-09-07 MED ORDER — NA SULFATE-K SULFATE-MG SULF 17.5-3.13-1.6 GM/177ML PO SOLN: 1.0000 | Freq: Once | ORAL | 0 refills | Status: AC

## 2020-09-07 NOTE — Telephone Encounter (Signed)
I am certainly willing to attempt this without sedation.  She did have sedation on both of her other 2 colonoscopies and so I guess I am a bit surprised that she wants to try without this time.  As long as she agrees to IV, and understands that if she becomes very uncomfortable then we will use our usual methods for sedation.  X

## 2020-09-07 NOTE — Progress Notes (Signed)
No egg or soy allergy known to patient  No issues with past sedation with any surgeries or procedures Patient denies ever being told they had issues or difficulty with intubation  No FH of Malignant Hyperthermia No diet pills per patient No home 02 use per patient  No blood thinners per patient  Pt denies issues with constipation  No A fib or A flutter  EMMI video to pt or via [place] 19 guidelines implemented in PV today with Pt and RN  Pt is fully vaccinated  for Covid    Coupon given to pt in PV today , Code to Pharmacy and  NO PA's for preps discussed with pt In PV today  Discussed with pt there will be an out-of-pocket cost for prep and that varies from $0 to 70 dollars   Due to the COVID-19 pandemic we are asking patients to follow certain guidelines.  Pt aware of COVID protocols and [facility] guidelines

## 2020-09-07 NOTE — Telephone Encounter (Signed)
Dr Ardis Hughs,  Ms. Scheunemann wishes to have colonoscopy without sedation if possible.  I explained that it typically not done, and that we would still have to start an IV in the event she cannot tolerate.  She wants to know if you are willing to attempt without sedation. Thank you. Raquel Sarna, RN

## 2020-09-21 ENCOUNTER — Encounter: Payer: Self-pay | Admitting: Gastroenterology

## 2020-09-21 ENCOUNTER — Ambulatory Visit (AMBULATORY_SURGERY_CENTER): Payer: 59 | Admitting: Gastroenterology

## 2020-09-21 ENCOUNTER — Other Ambulatory Visit: Payer: Self-pay | Admitting: Gastroenterology

## 2020-09-21 ENCOUNTER — Other Ambulatory Visit: Payer: Self-pay

## 2020-09-21 VITALS — BP 115/74 | HR 78 | Temp 98.4°F | Resp 18 | Ht 67.5 in | Wt 143.0 lb

## 2020-09-21 DIAGNOSIS — Z860100 Personal history of colon polyps, unspecified: Secondary | ICD-10-CM

## 2020-09-21 DIAGNOSIS — D124 Benign neoplasm of descending colon: Secondary | ICD-10-CM

## 2020-09-21 DIAGNOSIS — D120 Benign neoplasm of cecum: Secondary | ICD-10-CM

## 2020-09-21 MED ORDER — SODIUM CHLORIDE 0.9 % IV SOLN: 500.0000 mL | Freq: Once | INTRAVENOUS | Status: DC

## 2020-09-21 NOTE — Patient Instructions (Signed)
YOU HAD AN ENDOSCOPIC PROCEDURE TODAY AT THE [facility]:   Refer to the procedure report that was given to you for any specific questions about what was found during the examination.  If the procedure report does not answer your questions, please call your gastroenterologist to clarify.  If you requested that your care partner not be given the details of your procedure findings, then the procedure report has been included in a sealed envelope for you to review at your convenience later.  YOU SHOULD EXPECT: Some feelings of bloating in the abdomen. Passage of more gas than usual.  Walking can help get rid of the air that was put into your GI tract during the procedure and reduce the bloating. If you had a lower endoscopy (such as a colonoscopy or flexible sigmoidoscopy) you may notice spotting of blood in your stool or on the toilet paper. If you underwent a bowel prep for your procedure, you may not have a normal bowel movement for a few days.  Please Note:  You might notice some irritation and congestion in your nose or some drainage.  This is from the oxygen used during your procedure.  There is no need for concern and it should clear up in a day or so.  SYMPTOMS TO REPORT IMMEDIATELY:  Following lower endoscopy (colonoscopy or flexible sigmoidoscopy):  Excessive amounts of blood in the stool  Significant tenderness or worsening of abdominal pains  Swelling of the abdomen that is new, acute  Fever of 100F or higher   Black, tarry-looking stools  For urgent or emergent issues, a gastroenterologist can be reached at any hour by calling [phone]. Do not use MyChart messaging for urgent concerns.    DIET:  We do recommend a small meal at first, but then you may proceed to your regular diet.  Drink plenty of fluids but you should avoid alcoholic beverages for 24 hours.  MEDICATIONS: Continue present medications.  Please see handouts given to you by your recovery  nurse.  Thank you for allowing us to provide for your healthcare needs today.  ACTIVITY:  You should plan to take it easy for the rest of today and you should NOT DRIVE or use heavy machinery until tomorrow (because of the sedation medicines used during the test).    FOLLOW UP: Our staff will call the number listed on your records 48-72 hours following your procedure to check on you and address any questions or concerns that you may have regarding the information given to you following your procedure. If we do not reach you, we will leave a message.  We will attempt to reach you two times.  During this call, we will ask if you have developed any symptoms of COVID 19. If you develop any symptoms (ie: fever, flu-like symptoms, shortness of breath, cough etc.) before then, please call [phone].  If you test positive for Covid 19 in the 2 weeks post procedure, please call and report this information to us.    If any biopsies were taken you will be contacted by phone or by letter within the next 1-3 weeks.  Please call us at [phone] if you have not heard about the biopsies in 3 weeks.    SIGNATURES/CONFIDENTIALITY: You and/or your care partner have signed paperwork which will be entered into your electronic medical record.  These signatures attest to the fact that that the information above on your After Visit Summary has been reviewed and is understood.  Full   Full responsibility of the confidentiality of this discharge information lies with you and/or your care-partner.

## 2020-09-21 NOTE — Progress Notes (Signed)
VS by CW  Pt's states no medical or surgical changes since previsit or office visit.  

## 2020-09-21 NOTE — Op Note (Signed)
[facility] Patient Name: Carmen Allen Procedure Date: 09/21/2020 8:49 AM MRN: [id number] Endoscopist: Milus Banister , MD Age: 60 Referring MD:  Date of Birth: 1961/05/09 Gender: Female Account #: [id number] Procedure:                Colonoscopy Indications:              High risk colon cancer surveillance: Personal                            history of colonic polyps; colonoscopy 2014 Dr.                            Ardis Hughs found 2 polyps, one subCM TA and one 7mm                            TVA. Colonoscopy 2019 three subCM adenomas removed Medicines:                Monitored Anesthesia Care (after valiant attempt                            without sedation) Procedure:                Pre-Anesthesia Assessment:                           - Prior to the procedure, a History and Physical                            was performed, and patient medications and                            allergies were reviewed. The patient's tolerance of                            previous anesthesia was also reviewed. The risks                            and benefits of the procedure and the sedation                            options and risks were discussed with the patient.                            All questions were answered, and informed consent                            was obtained. Prior Anticoagulants: The patient has                            taken no previous anticoagulant or antiplatelet                            agents. ASA Grade Assessment: II - A patient with  mild systemic disease. After reviewing the risks                            and benefits, the patient was deemed in                            satisfactory condition to undergo the procedure.                           After obtaining informed consent, the colonoscope                            was passed under direct vision. Throughout the                            procedure, the patient's  blood pressure, pulse, and                            oxygen saturations were monitored continuously. The                            Olympus PFC-[id number] (#[id number]) Colonoscope was                            introduced through the anus and advanced to the the                            cecum, identified by appendiceal orifice and                            ileocecal valve. The colonoscopy was performed                            without difficulty. The patient tolerated the                            procedure well. The quality of the bowel                            preparation was good. The ileocecal valve,                            appendiceal orifice, and rectum were photographed. Scope In: 8:59:05 AM Scope Out: 9:19:46 AM Scope Withdrawal Time: 0 hours 11 minutes 4 seconds  Total Procedure Duration: 0 hours 20 minutes 41 seconds  Findings:                 Three sessile polyps were found in the descending                            colon and cecum. The polyps were 3 to 4 mm in size.                            These polyps were removed with a  cold snare.                            Resection and retrieval were complete.                           The exam was otherwise without abnormality on                            direct and retroflexion views. Complications:            No immediate complications. Estimated blood loss:                            None. Estimated Blood Loss:     Estimated blood loss: none. Impression:               - Three 3 to 4 mm polyps in the descending colon                            and in the cecum, removed with a cold snare.                            Resected and retrieved.                           - The examination was otherwise normal on direct                            and retroflexion views. Recommendation:           - Patient has a contact number available for                            emergencies. The signs and symptoms of potential                             delayed complications were discussed with the                            patient. Return to normal activities tomorrow.                            Written discharge instructions were provided to the                            patient.                           - Resume previous diet.                           - Continue present medications.                           - Await pathology results. Milus Banister, MD 09/21/2020 9:24:03 AM This report has been signed electronically.

## 2020-09-21 NOTE — Progress Notes (Signed)
To PACU, VSS. Report to [email or web address] 

## 2020-09-21 NOTE — Progress Notes (Signed)
Called to room to assist during endoscopic procedure.  Patient ID and intended procedure confirmed with present staff. Received instructions for my participation in the procedure from the performing physician.  

## 2020-09-23 ENCOUNTER — Telehealth: Payer: Self-pay

## 2020-09-23 NOTE — Telephone Encounter (Signed)
Second follow up phone call attempt, no answer, unable to LM 

## 2020-09-23 NOTE — Telephone Encounter (Signed)
First post procedure follow up call, no answer 

## 2020-09-23 NOTE — Telephone Encounter (Signed)
Patient returned call. States is doing fine. Just the normal abdominal pain after procedure per her words.

## 2020-09-30 ENCOUNTER — Encounter: Payer: Self-pay | Admitting: Gastroenterology

## 2020-12-19 ENCOUNTER — Other Ambulatory Visit: Payer: Self-pay | Admitting: Family Medicine

## 2020-12-19 ENCOUNTER — Other Ambulatory Visit: Payer: Self-pay | Admitting: Internal Medicine

## 2020-12-19 ENCOUNTER — Encounter: Payer: Self-pay | Admitting: Internal Medicine

## 2020-12-19 DIAGNOSIS — G4700 Insomnia, unspecified: Secondary | ICD-10-CM

## 2020-12-20 ENCOUNTER — Telehealth: Payer: Self-pay

## 2020-12-20 DIAGNOSIS — G4700 Insomnia, unspecified: Secondary | ICD-10-CM

## 2020-12-20 MED ORDER — CLONAZEPAM 0.5 MG PO TABS: 0.5000 mg | ORAL_TABLET | Freq: Two times a day (BID) | ORAL | 0 refills | Status: DC | PRN

## 2020-12-21 MED ORDER — CLONAZEPAM 0.5 MG PO TABS: ORAL_TABLET | ORAL | 3 refills | Status: DC

## 2020-12-21 NOTE — Telephone Encounter (Signed)
Last RF per controlled substance database: 11/19/20 Last OV: 06/29/20 Next OV: 12/24/20

## 2020-12-21 NOTE — Addendum Note (Signed)
Addended by: Biagio Borg on: 12/21/2020 05:06 PM   Modules accepted: Orders

## 2020-12-21 NOTE — Telephone Encounter (Deleted)
error 

## 2020-12-24 ENCOUNTER — Ambulatory Visit: Payer: 59 | Admitting: Internal Medicine

## 2020-12-27 ENCOUNTER — Other Ambulatory Visit: Payer: Self-pay

## 2020-12-28 ENCOUNTER — Encounter: Payer: Self-pay | Admitting: Internal Medicine

## 2020-12-28 ENCOUNTER — Ambulatory Visit (INDEPENDENT_AMBULATORY_CARE_PROVIDER_SITE_OTHER): Payer: 59 | Admitting: Internal Medicine

## 2020-12-28 ENCOUNTER — Other Ambulatory Visit: Payer: Self-pay

## 2020-12-28 VITALS — BP 120/68 | HR 86 | Temp 98.1°F | Ht 67.5 in | Wt 143.0 lb

## 2020-12-28 DIAGNOSIS — E7800 Pure hypercholesterolemia, unspecified: Secondary | ICD-10-CM

## 2020-12-28 DIAGNOSIS — F419 Anxiety disorder, unspecified: Secondary | ICD-10-CM

## 2020-12-28 DIAGNOSIS — M8580 Other specified disorders of bone density and structure, unspecified site: Secondary | ICD-10-CM

## 2020-12-28 DIAGNOSIS — F325 Major depressive disorder, single episode, in full remission: Secondary | ICD-10-CM

## 2020-12-28 DIAGNOSIS — E559 Vitamin D deficiency, unspecified: Secondary | ICD-10-CM | POA: Insufficient documentation

## 2020-12-28 MED ORDER — IMIPRAMINE HCL 50 MG PO TABS: 100.0000 mg | ORAL_TABLET | Freq: Every day | ORAL | 1 refills | Status: DC

## 2020-12-28 MED ORDER — ATORVASTATIN CALCIUM 10 MG PO TABS: 10.0000 mg | ORAL_TABLET | Freq: Every day | ORAL | 3 refills | Status: DC

## 2020-12-28 NOTE — Assessment & Plan Note (Signed)
Last vitamin D Lab Results  Component Value Date   VD25OH 22.72 (L) 07/20/2020   Low, to start oral replacement

## 2020-12-28 NOTE — Assessment & Plan Note (Signed)
Lab Results  Component Value Date   LDLCALC 108 (H) 07/20/2020   Mild uncontrolled, pt to continue current lipitor, declines change today

## 2020-12-28 NOTE — Progress Notes (Signed)
Chief Complaint: follow up anxiety, depression, HLD and ow vit d       HPI:  Carmen Allen is a 60 y.o. female here overeall doing ok; Denies worsening depressive symptoms, suicidal ideation, or panic; has ongoing anxiety, needs klonopin refill.  Pt denies chest pain, increased sob or doe, wheezing, orthopnea, PND, increased LE swelling, palpitations, dizziness or syncope.   Pt denies polydipsia, polyuria, or new focal neuro s/s.  Pt denies chest pain, increased sob or doe, wheezing, orthopnea, PND, increased LE swelling, palpitations, dizziness or syncope.   Pt denies fever, wt loss, night sweats, loss of appetite, or other constitutional symptoms  trying to follow lower chol diet.  Lasst DXA with very mild osteopenia and wants to review resuts.  Not taking calcium, vit d.        Wt Readings from Last 3 Encounters:  12/28/20 143 lb (64.9 kg)  09/21/20 143 lb (64.9 kg)  09/07/20 143 lb (64.9 kg)   BP Readings from Last 3 Encounters:  12/28/20 120/68  09/21/20 115/74  06/29/20 112/72         Past Medical History:  Diagnosis Date   Allergy    Depression    Leukocytosis    reports this way for as long as she can remember   Lumbar degenerative disc disease 10/10/2012   Migraine    No blood products 11/24/2014   [employer]    Past Surgical History:  Procedure Laterality Date   ABDOMINAL HYSTERECTOMY     oophorectomy, endometriosis   COLONOSCOPY  2014    reports that she has never smoked. She has never used smokeless tobacco. She reports current alcohol use of about 5.0 standard drinks of alcohol per week. She reports that she does not use drugs. family history includes Cancer in her mother; Diabetes in her father; Mental illness in her sister and sister. No Known Allergies Current Outpatient Medications on File Prior to Visit  Medication Sig Dispense Refill   clonazePAM (KLONOPIN) 0.5 MG tablet TAKE 1/2 TO 1 (ONE-HALF TO ONE) TABLET BY MOUTH AT BEDTIME AS NEEDED FOR  ANXIETY 30 tablet 3   fluticasone (FLONASE) 50 MCG/ACT nasal spray 1 spray up each nostril each bedtime     No current facility-administered medications on file prior to visit.        ROS:  All others reviewed and negative.  Objective        PE:  BP 120/68 (BP Location: Left Arm, Patient Position: Sitting, Cuff Size: Normal)   Pulse 86   Temp 98.1 F (36.7 C) (Oral)   Ht 5' 7.5" (1.715 m)   Wt 143 lb (64.9 kg)   SpO2 98%   BMI 22.07 kg/m                 Constitutional: Pt appears in NAD               HENT: Head: NCAT.                Right Ear: External ear normal.                 Left Ear: External ear normal.                Eyes: . Pupils are equal, round, and reactive to light. Conjunctivae and EOM are normal               Nose: without d/c or deformity  Neck: Neck supple. Gross normal ROM               Cardiovascular: Normal rate and regular rhythm.                 Pulmonary/Chest: Effort normal and breath sounds without rales or wheezing.                Abd:  Soft, NT, ND, + BS, no organomegaly               Neurological: Pt is alert. At baseline orientation, motor grossly intact               Skin: Skin is warm. No rashes, no other new lesions, LE edema - none               Psychiatric: Pt behavior is normal without agitation   Micro: none  Cardiac tracings I have personally interpreted today:  none  Pertinent Radiological findings (summarize): none   Lab Results  Component Value Date   WBC 3.1 (L) 07/20/2020   HGB 13.0 07/20/2020   HCT 37.5 07/20/2020   PLT 218.0 07/20/2020   GLUCOSE 88 07/20/2020   CHOL 210 (H) 07/20/2020   TRIG 64.0 07/20/2020   HDL 89.40 07/20/2020   LDLDIRECT 106.3 07/12/2011   LDLCALC 108 (H) 07/20/2020   ALT 17 07/20/2020   AST 24 07/20/2020   NA 139 07/20/2020   K 4.0 07/20/2020   CL 100 07/20/2020   CREATININE 0.90 07/20/2020   BUN 13 07/20/2020   CO2 29 07/20/2020   TSH 2.44 07/20/2020   HGBA1C 5.3 07/20/2020    Assessment/Plan:  Carmen Allen is a 60 y.o. White or Caucasian [1] female with  has a past medical history of Allergy, Depression, Leukocytosis, Lumbar degenerative disc disease (10/10/2012), Migraine, and No blood products (11/24/2014).  Vitamin D deficiency Last vitamin D Lab Results  Component Value Date   VD25OH 22.72 (L) 07/20/2020   Low, to start oral replacement   Hyperlipidemia Lab Results  Component Value Date   LDLCALC 108 (H) 07/20/2020   Mild uncontrolled, pt to continue current lipitor, declines change today   Depression, major, in remission ([place]) Ow stable, declines need for SSRI or referral couseling or psychiatry  Anxiety Stable, controlled, for klonpin refill,  to f/u any worsening symptoms or concerns  Osteopenia Very mild to minimal with worse T score at -1.1, , for oscal plus D, no need foxamax or prolla at this time, for f/u dxa at 3 yrs  Followup: Return in about 6 months (around 06/30/2021), or if symptoms worsen or fail to improve.  Cathlean Cower, MD 12/30/2020 8:27 PM [facility]

## 2020-12-28 NOTE — Patient Instructions (Signed)
Please check with your insurance about the Shingrix shingles shot coverage  Please take all new medication as prescribed - the lipitor 10 mg  Please continue all other medications as before, and refills have been done if requested.  Please have the pharmacy call with any other refills you may need.  Please continue your efforts at being more active, low cholesterol diet, and weight control  Please keep your appointments with your specialists as you may have planned  Please go to the LAB at the blood drawing area for the tests to be done at the [facility] lab in 4 wks  You will be contacted by phone if any changes need to be made immediately.  Otherwise, you will receive a letter about your results with an explanation, but please check with MyChart first.  Please remember to sign up for MyChart if you have not done so, as this will be important to you in the future with finding out test results, communicating by private email, and scheduling acute appointments online when needed.  Please make an Appointment to return in 6 months, or sooner if needed

## 2020-12-30 ENCOUNTER — Encounter: Payer: Self-pay | Admitting: Internal Medicine

## 2020-12-30 DIAGNOSIS — M8580 Other specified disorders of bone density and structure, unspecified site: Secondary | ICD-10-CM | POA: Insufficient documentation

## 2020-12-30 NOTE — Assessment & Plan Note (Addendum)
Very mild to minimal with worse T score at -1.1, , for oscal plus D, no need foxamax or prolla at this time, for f/u dxa at 3 yrs

## 2020-12-30 NOTE — Assessment & Plan Note (Signed)
Stable, controlled, for klonpin refill,  to f/u any worsening symptoms or concerns

## 2020-12-30 NOTE — Assessment & Plan Note (Signed)
Ow stable, declines need for SSRI or referral couseling or psychiatry

## 2021-03-29 ENCOUNTER — Other Ambulatory Visit (HOSPITAL_BASED_OUTPATIENT_CLINIC_OR_DEPARTMENT_OTHER): Payer: Self-pay | Admitting: Internal Medicine

## 2021-03-29 DIAGNOSIS — Z1231 Encounter for screening mammogram for malignant neoplasm of breast: Secondary | ICD-10-CM

## 2021-03-31 DIAGNOSIS — Z1231 Encounter for screening mammogram for malignant neoplasm of breast: Secondary | ICD-10-CM

## 2021-04-13 ENCOUNTER — Other Ambulatory Visit: Payer: Self-pay | Admitting: Internal Medicine

## 2021-04-13 DIAGNOSIS — F325 Major depressive disorder, single episode, in full remission: Secondary | ICD-10-CM

## 2021-04-14 ENCOUNTER — Ambulatory Visit: Payer: 59

## 2021-05-12 ENCOUNTER — Other Ambulatory Visit (HOSPITAL_BASED_OUTPATIENT_CLINIC_OR_DEPARTMENT_OTHER): Payer: Self-pay | Admitting: Internal Medicine

## 2021-05-12 ENCOUNTER — Ambulatory Visit (INDEPENDENT_AMBULATORY_CARE_PROVIDER_SITE_OTHER): Payer: 59

## 2021-05-12 ENCOUNTER — Other Ambulatory Visit: Payer: Self-pay

## 2021-05-12 DIAGNOSIS — Z1231 Encounter for screening mammogram for malignant neoplasm of breast: Secondary | ICD-10-CM

## 2021-05-13 ENCOUNTER — Other Ambulatory Visit: Payer: Self-pay | Admitting: Internal Medicine

## 2021-05-13 DIAGNOSIS — R928 Other abnormal and inconclusive findings on diagnostic imaging of breast: Secondary | ICD-10-CM

## 2021-05-31 ENCOUNTER — Other Ambulatory Visit: Payer: 59

## 2021-06-03 ENCOUNTER — Telehealth: Payer: Self-pay | Admitting: Internal Medicine

## 2021-06-03 NOTE — Telephone Encounter (Signed)
Patient tested positive for Covid on Sunday morning and wants to know if she can have an Rx filled for covid.

## 2021-06-03 NOTE — Telephone Encounter (Signed)
Paitent states she feels ok and would rather the husband receive medication.

## 2021-06-12 ENCOUNTER — Other Ambulatory Visit: Payer: Self-pay | Admitting: Internal Medicine

## 2021-06-12 DIAGNOSIS — G4700 Insomnia, unspecified: Secondary | ICD-10-CM

## 2021-06-20 ENCOUNTER — Ambulatory Visit
Admission: EM | Admit: 2021-06-20 | Discharge: 2021-06-20 | Disposition: A | Discharge status: Home / Self Care | Payer: Self-pay | Attending: Emergency Medicine | Admitting: Emergency Medicine

## 2021-06-20 ENCOUNTER — Other Ambulatory Visit: Payer: Self-pay

## 2021-06-20 DIAGNOSIS — B349 Viral infection, unspecified: Secondary | ICD-10-CM | POA: Diagnosis not present

## 2021-06-20 MED ORDER — CETIRIZINE HCL 10 MG PO TABS: 10.0000 mg | ORAL_TABLET | Freq: Every day | ORAL | 2 refills | Status: AC

## 2021-06-20 MED ORDER — FLUTICASONE PROPIONATE 50 MCG/ACT NA SUSP: 2.0000 | Freq: Every day | NASAL | 0 refills | Status: AC

## 2021-06-20 MED ORDER — IBUPROFEN 400 MG PO TABS: 400.0000 mg | ORAL_TABLET | Freq: Three times a day (TID) | ORAL | 0 refills | Status: DC | PRN

## 2021-06-20 NOTE — Discharge Instructions (Signed)
Your symptoms and physical exam findings are concerning for a viral respiratory infection.  Based on my physical exam findings, I believe you are suffering from the common cold caused by one of the numerous viruses circulating in our community right now..   You were tested for both COVID and influenza today because here in the urgent care setting, we do not have an available option for an individual influenza test.  I apologize for the redundancy, please disregard the COVID result as it will be unreliable given your recent COVID infection.  The result of your viral testing will be posted to your MyChart once it is complete, this typically takes 24 to 48 hours.  If there is a positive result, you will be contacted by phone with further recommendations, if any.    Conservative care is recommended at this time.  This includes rest, pushing clear fluids and activity as tolerated.  Warm beverages such as teas and broths versus cold beverages/popsicles and frozen sherbet/sorbet are your choice, both warm and cold are beneficial.  You may also notice that your appetite is reduced; this is okay as long as you are drinking plenty of clear fluids.    Please see the list below for recommended medications, dosages and frequencies to provide relief of your current symptoms:     Ibuprofen  (Advil, Motrin): This is a good anti-inflammatory medication which addresses aches, pains and inflammation of the upper airways that causes sinus and nasal congestion as well as in the lower airways which makes your cough feel tight and sometimes burn.  I recommend that you take between 400 to 600 mg every 6-8 hours as needed, I have provided you with a prescription for 400 mg.      Fluticasone (Flonase): This is a steroid nasal spray that you use once daily, 1 spray in each nare.  After 3 to 5 days of use, you will have significant improvement of the inflammation and mucus production that is being caused by exposure to allergens.   This medication can be purchased over-the-counter however I have provided you with a prescription.      Cetirizine (Zyrtec): This is an excellent second-generation antihistamine that helps to reduce respiratory inflammatory response to viruses and environmental allergens.  Please take 1 tablet daily at bedtime.  I provided you with a prescription for this medication.   Guaifenesin (Robitussin, Mucinex): This is an expectorant.  This helps break up chest congestion and loosen up thick nasal drainage making phlegm and drainage more liquid and therefore easier to remove.  I recommend being 400 mg three times daily as needed.  I do not think that she will require this medication at this time however it is possible that you could develop a need for it.  This can be purchased off-the-shelf, be sure to look at the ingredients of the product you by to make sure there are no other "active" ingredients besides guaifenesin included.  Please remain home from work, school, public places until you have been feeling significantly better without the use of over-the-counter medications for 24 hours, as we discussed immune system may still be blunted due to your recent COVID infection and I do recommend that you consider wearing a mask when you are around other people outside of your home.    Please follow-up within the next 3 to 5 days either with your primary care provider or urgent care if your symptoms do not resolve.

## 2021-06-20 NOTE — ED Triage Notes (Signed)
Pt reports this weekend feeling bad (sore throat, headache and generalized discomfort), she reports having a negative covid test this weekend.

## 2021-06-20 NOTE — ED Provider Notes (Signed)
[facility]    CSN: [id number] Arrival date & time: 06/20/21  6301    HISTORY   Chief Complaint  Patient presents with   Sore Throat   Headache   HPI Carmen Allen is a 61 y.o. female. Pt reports this weekend feeling bad (sore throat, headache and generalized discomfort), she reports having a positive covid test January 1 of this year, states she finally felt better about a week ago, expresses frustration with feeling sick again at this time.  Patient states she took a home COVID test yesterday which was negative.  The history is provided by the patient.  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    Home Medications    Prior to Admission medications   Medication Sig Start Date End Date Taking? Authorizing Provider  atorvastatin (LIPITOR) 10 MG tablet Take 1 tablet (10 mg total) by mouth daily. 12/28/20 12/28/21  Biagio Borg, MD  clonazePAM (KLONOPIN) 0.5 MG tablet TAKE 1/2 (ONE-HALF) TO 1 TABLET BY MOUTH AT BEDTIME AS NEEDED FOR ANXIETY 06/12/21   Biagio Borg, MD  fluticasone [facility]) 50 MCG/ACT nasal spray 1 spray up each nostril each bedtime 11/12/13   [provider]   imipramine (TOFRANIL) 50 MG tablet TAKE 2 TABLETS BY MOUTH AT BEDTIME 04/13/21   Biagio Borg, MD   Family History Family History  Problem Relation Age of Onset   Cancer Mother    Diabetes Father    Mental illness Sister        anxiety   Mental illness Sister        ADHD   Colon cancer Neg Hx    Esophageal cancer Neg Hx    Rectal cancer Neg Hx    Stomach cancer Neg Hx    Social History Social History   Tobacco Use   Smoking status: Never   Smokeless tobacco: Never  Vaping Use   Vaping Use: Never used  Substance Use Topics   Alcohol use: Yes    Alcohol/week: 5.0 standard drinks    Types: 5 Glasses of wine per week   Drug use: No   Allergies   Patient has no known allergies.  Review of Systems Review of Systems Pertinent findings noted in history of present illness.   Physical Exam Triage Vital Signs ED Triage Vitals  Enc Vitals Group     BP 03/25/21 0827 (!) 147/82     Pulse Rate 03/25/21 0827 72     Resp 03/25/21 0827 18     Temp 03/25/21 0827 98.3 F (36.8 C)     Temp Source 03/25/21 0827 Oral  SpO2 03/25/21 0827 98 %     Weight --      Height --      Head Circumference --      Peak Flow --      Pain Score 03/25/21 0826 5     Pain Loc --      Pain Edu? --      Excl. in [place]? --   No data found.  Updated Vital Signs BP 122/80 (BP Location: Left Arm)    Pulse 90    Temp 98.6 F (37 C) (Oral)    Resp 18    SpO2 97%   Physical Exam Vitals and nursing note reviewed.  Constitutional:      General: She is not in acute distress.    Appearance: Normal appearance. She is not ill-appearing.  HENT:     Head: Normocephalic and atraumatic.     Salivary Glands: Right salivary gland is not diffusely enlarged or tender. Left salivary gland is not diffusely enlarged or tender.     Right Ear: Tympanic membrane, ear canal and external ear normal. No drainage. No middle ear effusion. There is no impacted cerumen. Tympanic membrane is not erythematous or bulging.      Left Ear: Tympanic membrane, ear canal and external ear normal. No drainage.  No middle ear effusion. There is no impacted cerumen. Tympanic membrane is not erythematous or bulging.     Nose: Nose normal. No nasal deformity, septal deviation, mucosal edema, congestion or rhinorrhea.     Right Turbinates: Not enlarged, swollen or pale.     Left Turbinates: Not enlarged, swollen or pale.     Right Sinus: No maxillary sinus tenderness or frontal sinus tenderness.     Left Sinus: No maxillary sinus tenderness or frontal sinus tenderness.     Mouth/Throat:     Lips: Pink. No lesions.     Mouth: Mucous membranes are moist. No oral lesions.     Pharynx: Oropharynx is clear. Uvula midline. No posterior oropharyngeal erythema or uvula swelling.     Tonsils: No tonsillar exudate. 0 on the right. 0 on the left.  Eyes:     General: Lids are normal.        Right eye: No discharge.        Left eye: No discharge.     Extraocular Movements: Extraocular movements intact.     Conjunctiva/sclera: Conjunctivae normal.     Right eye: Right conjunctiva is not injected.     Left eye: Left conjunctiva is not injected.  Neck:     Trachea: Trachea and phonation normal.  Cardiovascular:     Rate and Rhythm: Normal rate and regular rhythm.     Pulses: Normal pulses.     Heart sounds: Normal heart sounds. No murmur heard.   No friction rub. No gallop.  Pulmonary:     Effort: Pulmonary effort is normal. No accessory muscle usage, prolonged expiration or respiratory distress.     Breath sounds: Normal breath sounds. No stridor, decreased air movement or transmitted upper airway sounds. No decreased breath sounds, wheezing, rhonchi or rales.  Chest:     Chest wall: No tenderness.  Musculoskeletal:        General: Normal range of motion.     Cervical back: Normal range of motion and neck supple. Normal range of motion.  Lymphadenopathy:     Cervical: No cervical adenopathy.  Skin:    General: Skin is warm  and dry.  Findings: No erythema or rash.  Neurological:     General: No focal deficit present.     Mental Status: She is alert and oriented to person, place, and time.  Psychiatric:        Mood and Affect: Mood normal.        Behavior: Behavior normal.    Visual Acuity Right Eye Distance:   Left Eye Distance:   Bilateral Distance:    Right Eye Near:   Left Eye Near:    Bilateral Near:     UC Couse / Diagnostics / Procedures:    EKG  Radiology No results found.  Procedures Procedures (including critical care time)  UC Diagnoses / Final Clinical Impressions(s)   I have reviewed the triage vital signs and the nursing notes.  Pertinent labs & imaging results that were available during my care of the patient were reviewed by me and considered in my medical decision making (see chart for details).   Final diagnoses:  Viral illness   Influenza testing performed, will notify patient of results once received.  Feel that this is unlikely and that she has benign virus that is actively circulating in the community right now.  Patient is well-appearing otherwise.  Supportive care recommended.  Return precautions advised.  ED Prescriptions     Medication Sig Dispense Auth. Provider   fluticasone (FLONASE) 50 MCG/ACT nasal spray Place 2 sprays into both nostrils daily. 18 mL Lynden Oxford Scales, PA-C   ibuprofen (ADVIL) 400 MG tablet Take 1 tablet (400 mg total) by mouth every 8 (eight) hours as needed for up to 30 doses. 30 tablet Lynden Oxford Scales, PA-C   cetirizine (ZYRTEC ALLERGY) 10 MG tablet Take 1 tablet (10 mg total) by mouth at bedtime. 30 tablet Lynden Oxford Scales, PA-C      PDMP not reviewed this encounter.  Pending results:  Labs Reviewed  COVID-19, FLU A+B NAA    Medications Ordered in UC: Medications - No data to display  Disposition Upon Discharge:  Condition: stable for discharge home Home: take medications as prescribed; routine discharge  instructions as discussed; follow up as advised.  Patient presented with an acute illness with associated systemic symptoms and significant discomfort requiring urgent management. In my opinion, this is a condition that a prudent lay person (someone who possesses an average knowledge of health and medicine) may potentially expect to result in complications if not addressed urgently such as respiratory distress, impairment of bodily function or dysfunction of bodily organs.   Routine symptom specific, illness specific and/or disease specific instructions were discussed with the patient and/or caregiver at length.   As such, the patient has been evaluated and assessed, work-up was performed and treatment was provided in alignment with urgent care protocols and evidence based medicine.  Patient/parent/caregiver has been advised that the patient may require follow up for further testing and treatment if the symptoms continue in spite of treatment, as clinically indicated and appropriate.  If the patient was tested for COVID-19, Influenza and/or RSV, then the patient/parent/guardian was advised to isolate at home pending the results of his/her diagnostic coronavirus test and potentially longer if theyre positive. I have also advised pt that if his/her COVID-19 test returns positive, it's recommended to self-isolate for at least 10 days after symptoms first appeared AND until fever-free for 24 hours without fever reducer AND other symptoms have improved or resolved. Discussed self-isolation recommendations as well as instructions for household member/close contacts as per the CDC and [place] DHHS,  and also gave patient the COVID packet with this information.  Patient/parent/caregiver has been advised to return to the [facility] or PCP in 3-5 days if no better; to PCP or the Emergency Department if new signs and symptoms develop, or if the current signs or symptoms continue to change or worsen for further workup, evaluation  and treatment as clinically indicated and appropriate  The patient will follow up with their current PCP if and as advised. If the patient does not currently have a PCP we will assist them in obtaining one.   The patient may need specialty follow up if the symptoms continue, in spite of conservative treatment and management, for further workup, evaluation, consultation and treatment as clinically indicated and appropriate.  Patient/parent/caregiver verbalized understanding and agreement of plan as discussed.  All questions were addressed during visit.  Please see discharge instructions below for further details of plan.  Discharge Instructions:   Discharge Instructions      Your symptoms and physical exam findings are concerning for a viral respiratory infection.  Based on my physical exam findings, I believe you are suffering from the common cold caused by one of the numerous viruses circulating in our community right now..   You were tested for both COVID and influenza today because here in the urgent care setting, we do not have an available option for an individual influenza test.  I apologize for the redundancy, please disregard the COVID result as it will be unreliable given your recent COVID infection.  The result of your viral testing will be posted to your MyChart once it is complete, this typically takes 24 to 48 hours.  If there is a positive result, you will be contacted by phone with further recommendations, if any.    Conservative care is recommended at this time.  This includes rest, pushing clear fluids and activity as tolerated.  Warm beverages such as teas and broths versus cold beverages/popsicles and frozen sherbet/sorbet are your choice, both warm and cold are beneficial.  You may also notice that your appetite is reduced; this is okay as long as you are drinking plenty of clear fluids.    Please see the list below for recommended medications, dosages and frequencies to  provide relief of your current symptoms:     Ibuprofen  (Advil, Motrin): This is a good anti-inflammatory medication which addresses aches, pains and inflammation of the upper airways that causes sinus and nasal congestion as well as in the lower airways which makes your cough feel tight and sometimes burn.  I recommend that you take between 400 to 600 mg every 6-8 hours as needed, I have provided you with a prescription for 400 mg.      Fluticasone (Flonase): This is a steroid nasal spray that you use once daily, 1 spray in each nare.  After 3 to 5 days of use, you will have significant improvement of the inflammation and mucus production that is being caused by exposure to allergens.  This medication can be purchased over-the-counter however I have provided you with a prescription.      Cetirizine (Zyrtec): This is an excellent second-generation antihistamine that helps to reduce respiratory inflammatory response to viruses and environmental allergens.  Please take 1 tablet daily at bedtime.  I provided you with a prescription for this medication.   Guaifenesin (Robitussin, Mucinex): This is an expectorant.  This helps break up chest congestion and loosen up thick nasal drainage making phlegm and drainage more liquid and therefore  easier to remove.  I recommend being 400 mg three times daily as needed.  I do not think that she will require this medication at this time however it is possible that you could develop a need for it.  This can be purchased off-the-shelf, be sure to look at the ingredients of the product you by to make sure there are no other "active" ingredients besides guaifenesin included.  Please remain home from work, school, public places until you have been feeling significantly better without the use of over-the-counter medications for 24 hours, as we discussed immune system may still be blunted due to your recent COVID infection and I do recommend that you consider wearing a mask when  you are around other people outside of your home.    Please follow-up within the next 3 to 5 days either with your primary care provider or urgent care if your symptoms do not resolve.        This office note has been dictated using Museum/gallery curator.  Unfortunately, and despite my best efforts, this method of dictation can sometimes lead to occasional typographical or grammatical errors.  I apologize in advance if this occurs.     Lynden Oxford Scales, PA-C 06/20/21 1407

## 2021-06-21 LAB — COVID-19, FLU A+B NAA
Influenza A, NAA: NOT DETECTED
Influenza B, NAA: NOT DETECTED
SARS-CoV-2, NAA: NOT DETECTED

## 2021-06-24 ENCOUNTER — Other Ambulatory Visit: Payer: Self-pay

## 2021-07-01 ENCOUNTER — Ambulatory Visit: Payer: Self-pay | Admitting: Internal Medicine

## 2021-07-06 ENCOUNTER — Other Ambulatory Visit: Payer: Self-pay

## 2021-07-13 DIAGNOSIS — L821 Other seborrheic keratosis: Secondary | ICD-10-CM | POA: Diagnosis not present

## 2021-07-13 DIAGNOSIS — L814 Other melanin hyperpigmentation: Secondary | ICD-10-CM | POA: Diagnosis not present

## 2021-07-13 DIAGNOSIS — D225 Melanocytic nevi of trunk: Secondary | ICD-10-CM | POA: Diagnosis not present

## 2021-07-13 DIAGNOSIS — D485 Neoplasm of uncertain behavior of skin: Secondary | ICD-10-CM | POA: Diagnosis not present

## 2021-07-20 ENCOUNTER — Ambulatory Visit: Payer: Self-pay

## 2021-07-20 ENCOUNTER — Ambulatory Visit
Admission: RE | Admit: 2021-07-20 | Discharge: 2021-07-20 | Disposition: A | Discharge status: Home / Self Care | Payer: 59 | Source: Ambulatory Visit | Attending: Internal Medicine | Admitting: Internal Medicine

## 2021-07-20 DIAGNOSIS — R928 Other abnormal and inconclusive findings on diagnostic imaging of breast: Secondary | ICD-10-CM

## 2021-07-20 DIAGNOSIS — N6489 Other specified disorders of breast: Secondary | ICD-10-CM | POA: Diagnosis not present

## 2021-07-20 DIAGNOSIS — R922 Inconclusive mammogram: Secondary | ICD-10-CM | POA: Diagnosis not present

## 2021-09-19 ENCOUNTER — Other Ambulatory Visit: Payer: Self-pay | Admitting: Internal Medicine

## 2021-09-19 ENCOUNTER — Encounter: Payer: Self-pay | Admitting: Internal Medicine

## 2021-09-19 ENCOUNTER — Ambulatory Visit (INDEPENDENT_AMBULATORY_CARE_PROVIDER_SITE_OTHER): Payer: 59

## 2021-09-19 ENCOUNTER — Ambulatory Visit (INDEPENDENT_AMBULATORY_CARE_PROVIDER_SITE_OTHER): Payer: 59 | Admitting: Internal Medicine

## 2021-09-19 ENCOUNTER — Telehealth: Payer: Self-pay

## 2021-09-19 VITALS — BP 112/66 | HR 79 | Temp 97.7°F | Ht 67.5 in | Wt 148.6 lb

## 2021-09-19 DIAGNOSIS — Z0001 Encounter for general adult medical examination with abnormal findings: Secondary | ICD-10-CM | POA: Diagnosis not present

## 2021-09-19 DIAGNOSIS — E538 Deficiency of other specified B group vitamins: Secondary | ICD-10-CM | POA: Diagnosis not present

## 2021-09-19 DIAGNOSIS — E559 Vitamin D deficiency, unspecified: Secondary | ICD-10-CM | POA: Diagnosis not present

## 2021-09-19 DIAGNOSIS — R739 Hyperglycemia, unspecified: Secondary | ICD-10-CM | POA: Diagnosis not present

## 2021-09-19 DIAGNOSIS — R079 Chest pain, unspecified: Secondary | ICD-10-CM | POA: Diagnosis not present

## 2021-09-19 DIAGNOSIS — E7800 Pure hypercholesterolemia, unspecified: Secondary | ICD-10-CM | POA: Diagnosis not present

## 2021-09-19 DIAGNOSIS — M549 Dorsalgia, unspecified: Secondary | ICD-10-CM

## 2021-09-19 DIAGNOSIS — M546 Pain in thoracic spine: Secondary | ICD-10-CM | POA: Diagnosis not present

## 2021-09-19 DIAGNOSIS — R911 Solitary pulmonary nodule: Secondary | ICD-10-CM

## 2021-09-19 LAB — CBC WITH DIFFERENTIAL/PLATELET
Basophils Absolute: 0 10*3/uL (ref 0.0–0.1)
Basophils Relative: 0.3 % (ref 0.0–3.0)
Eosinophils Absolute: 0.2 10*3/uL (ref 0.0–0.7)
Eosinophils Relative: 6 % — ABNORMAL HIGH (ref 0.0–5.0)
HCT: 39.2 % (ref 36.0–46.0)
Hemoglobin: 13.3 g/dL (ref 12.0–15.0)
Lymphocytes Relative: 41.5 % (ref 12.0–46.0)
Lymphs Abs: 1.7 10*3/uL (ref 0.7–4.0)
MCHC: 33.8 g/dL (ref 30.0–36.0)
MCV: 97.5 fl (ref 78.0–100.0)
Monocytes Absolute: 0.4 10*3/uL (ref 0.1–1.0)
Monocytes Relative: 9.9 % (ref 3.0–12.0)
Neutro Abs: 1.7 10*3/uL (ref 1.4–7.7)
Neutrophils Relative %: 42.3 % — ABNORMAL LOW (ref 43.0–77.0)
Platelets: 216 10*3/uL (ref 150.0–400.0)
RBC: 4.02 Mil/uL (ref 3.87–5.11)
RDW: 12.8 % (ref 11.5–15.5)
WBC: 4 10*3/uL (ref 4.0–10.5)

## 2021-09-19 LAB — LIPID PANEL
Cholesterol: 236 mg/dL — ABNORMAL HIGH (ref 0–200)
HDL: 89 mg/dL (ref >39.00)
LDL Cholesterol: 135 mg/dL — ABNORMAL HIGH (ref 0–99)
NonHDL: 146.72
Total CHOL/HDL Ratio: 3
Triglycerides: 58 mg/dL (ref 0.0–149.0)
VLDL: 11.6 mg/dL (ref 0.0–40.0)

## 2021-09-19 LAB — URINALYSIS, ROUTINE W REFLEX MICROSCOPIC
Bilirubin Urine: NEGATIVE
Hgb urine dipstick: NEGATIVE
Ketones, ur: NEGATIVE
Leukocytes,Ua: NEGATIVE
Nitrite: NEGATIVE
Specific Gravity, Urine: 1.01 (ref 1.000–1.030)
Total Protein, Urine: NEGATIVE
Urine Glucose: NEGATIVE
Urobilinogen, UA: 1 (ref 0.0–1.0)
WBC, UA: NONE SEEN (ref 0–?)
pH: 7 (ref 5.0–8.0)

## 2021-09-19 LAB — BASIC METABOLIC PANEL
BUN: 18 mg/dL (ref 6–23)
CO2: 32 mEq/L (ref 19–32)
Calcium: 9.4 mg/dL (ref 8.4–10.5)
Chloride: 98 mEq/L (ref 96–112)
Creatinine, Ser: 0.8 mg/dL (ref 0.40–1.20)
GFR: 79.99 mL/min (ref >60.00)
Glucose, Bld: 109 mg/dL — ABNORMAL HIGH (ref 70–99)
Potassium: 3.9 mEq/L (ref 3.5–5.1)
Sodium: 137 mEq/L (ref 135–145)

## 2021-09-19 LAB — VITAMIN B12: Vitamin B-12: 230 pg/mL (ref 211–911)

## 2021-09-19 LAB — HEMOGLOBIN A1C: Hgb A1c MFr Bld: 5.5 % (ref 4.6–6.5)

## 2021-09-19 LAB — VITAMIN D 25 HYDROXY (VIT D DEFICIENCY, FRACTURES): VITD: 37.17 ng/mL (ref 30.00–100.00)

## 2021-09-19 LAB — HEPATIC FUNCTION PANEL
ALT: 21 U/L (ref 0–35)
AST: 28 U/L (ref 0–37)
Albumin: 4.6 g/dL (ref 3.5–5.2)
Alkaline Phosphatase: 40 U/L (ref 39–117)
Bilirubin, Direct: 0.1 mg/dL (ref 0.0–0.3)
Total Bilirubin: 0.7 mg/dL (ref 0.2–1.2)
Total Protein: 7.2 g/dL (ref 6.0–8.3)

## 2021-09-19 LAB — SEDIMENTATION RATE: Sed Rate: 15 mm/hr (ref 0–30)

## 2021-09-19 LAB — TSH: TSH: 1.93 u[IU]/mL (ref 0.35–5.50)

## 2021-09-19 NOTE — Patient Instructions (Addendum)
Please consider the Shingrix if covered by your insurance ? ?Please take OTC Vitamin D3 at 2000 units per day, indefinitely ? ?Please continue all other medications as before, and refills have been done if requested. ? ?Please have the pharmacy call with any other refills you may need. ? ?Please continue your efforts at being more active, low cholesterol diet, and weight control. ? ?You are otherwise up to date with prevention measures today. ? ?Please keep your appointments with your specialists as you may have planned ? ?Please go to the XRAY Department in the first floor for the x-ray testing ? ?Please go to the LAB at the blood drawing area for the tests to be done ? ?You will be contacted by phone if any changes need to be made immediately.  Otherwise, you will receive a letter about your results with an explanation, but please check with MyChart first. ? ?Please remember to sign up for MyChart if you have not done so, as this will be important to you in the future with finding out test results, communicating by private email, and scheduling acute appointments online when needed. ? ?Please make an Appointment to return for your 1 year visit, or sooner if needed ?

## 2021-09-19 NOTE — Telephone Encounter (Signed)
Ray with Radiology called and states that patient's chest x-ray on the lateral view there is a focal retrosternal opacity which is non-specific. Pulmonary nodule or mediastinum mass not entirely excluded. Recommend further evaluation with chest CT. ?

## 2021-09-19 NOTE — Assessment & Plan Note (Signed)
Last vitamin D ?Lab Results  ?Component Value Date  ? VD25OH 22.72 (L) 07/20/2020  ? ?Low, to start oral replacement ? ?

## 2021-09-19 NOTE — Progress Notes (Signed)
Patient ID: Carmen Allen, female   DOB: May 11, 1961, 61 y.o.   MRN: [id number] ? ? ? ?     Chief Complaint:: wellness exam and back and chest pain ? ?     HPI:  Carmen Allen is a 61 y.o. female here for wellness exam; decliens shingrix and HIV screen, o/w up to date ?         ?              Also c/o an unusual pain for 2-3 wks, hard to localize but may be worst at the right scapular area, constant, mild to mod, dull, without other radiation, nothing else makes better or worse, specifically Not worse with any shoulder/arm use, and in fact lifts weights without worsening pain.  No cough, fever, sob and pain is non pleuritic, positional or exertional.  She is concerned bc a relative had a late diagnosed malignancy that incidentally occurred in the same area and involved the spine.   ?  ?Wt Readings from Last 3 Encounters:  ?09/19/21 148 lb 9.6 oz (67.4 kg)  ?12/28/20 143 lb (64.9 kg)  ?09/21/20 143 lb (64.9 kg)  ? ?BP Readings from Last 3 Encounters:  ?09/19/21 112/66  ?06/20/21 122/80  ?12/28/20 120/68  ? ?Immunization History  ?Administered Date(s) Administered  ? PFIZER(Purple Top)SARS-COV-2 Vaccination 08/14/2019, 09/09/2019, 05/20/2020  ? Tdap 10/10/2012  ? ?There are no preventive care reminders to display for this patient. ? ?  ? ?Past Medical History:  ?Diagnosis Date  ? Allergy   ? Depression   ? Leukocytosis   ? reports this way for as long as she can remember  ? Lumbar degenerative disc disease 10/10/2012  ? Migraine   ? No blood products 11/24/2014  ? Jehovah's witness   ? ?Past Surgical History:  ?Procedure Laterality Date  ? ABDOMINAL HYSTERECTOMY    ? oophorectomy, endometriosis  ? COLONOSCOPY  2014  ? ? reports that she has never smoked. She has never used smokeless tobacco. She reports current alcohol use of about 5.0 standard drinks per week. She reports that she does not use drugs. ?family history includes Cancer in her mother; Diabetes in her father; Mental illness in her sister and sister. ?No  Known Allergies ?Current Outpatient Medications on File Prior to Visit  ?Medication Sig Dispense Refill  ? clonazePAM (KLONOPIN) 0.5 MG tablet TAKE 1/2 (ONE-HALF) TO 1 TABLET BY MOUTH AT BEDTIME AS NEEDED FOR ANXIETY 30 tablet 5  ? fluticasone (FLONASE) 50 MCG/ACT nasal spray Place 2 sprays into both nostrils daily. 18 mL 0  ? ibuprofen (ADVIL) 400 MG tablet Take 1 tablet (400 mg total) by mouth every 8 (eight) hours as needed for up to 30 doses. 30 tablet 0  ? imipramine (TOFRANIL) 50 MG tablet TAKE 2 TABLETS BY MOUTH AT BEDTIME 180 tablet 1  ? cetirizine (ZYRTEC ALLERGY) 10 MG tablet Take 1 tablet (10 mg total) by mouth at bedtime. 30 tablet 2  ? ?No current facility-administered medications on file prior to visit.  ? ?     ROS:  All others reviewed and negative. ? ?Objective  ? ?     PE:  BP 112/66 (BP Location: Left Arm, Patient Position: Sitting, Cuff Size: Normal)   Pulse 79   Temp 97.7 ?F (36.5 ?C) (Oral)   Ht 5' 7.5" (1.715 m)   Wt 148 lb 9.6 oz (67.4 kg)   SpO2 99%   BMI 22.93 kg/m?  ? ?  Constitutional: Pt appears in NAD ?              HENT: Head: NCAT.  ?              Right Ear: External ear normal.   ?              Left Ear: External ear normal.  ?              Eyes: . Pupils are equal, round, and reactive to light. Conjunctivae and EOM are normal ?              Nose: without d/c or deformity ?              Neck: Neck supple. Gross normal ROM ?              Cardiovascular: Normal rate and regular rhythm.   ?              Pulmonary/Chest: Effort normal and breath sounds without rales or wheezing.  ?              Abd:  Soft, NT, ND, + BS, no organomegaly ?              Neurological: Pt is alert. At baseline orientation, motor grossly intact ?              Skin: Skin is warm. No rashes, no other new lesions, LE edema - none ?              Psychiatric: Pt behavior is normal without agitation  ? ?Micro: none ? ?Cardiac tracings I have personally interpreted today:  none ? ?Pertinent  Radiological findings (summarize): none  ? ?Lab Results  ?Component Value Date  ? WBC 4.0 09/19/2021  ? HGB 13.3 09/19/2021  ? HCT 39.2 09/19/2021  ? PLT 216.0 09/19/2021  ? GLUCOSE 109 (H) 09/19/2021  ? CHOL 236 (H) 09/19/2021  ? TRIG 58.0 09/19/2021  ? HDL 89.00 09/19/2021  ? LDLDIRECT 106.3 07/12/2011  ? LDLCALC 135 (H) 09/19/2021  ? ALT 21 09/19/2021  ? AST 28 09/19/2021  ? NA 137 09/19/2021  ? K 3.9 09/19/2021  ? CL 98 09/19/2021  ? CREATININE 0.80 09/19/2021  ? BUN 18 09/19/2021  ? CO2 32 09/19/2021  ? TSH 1.93 09/19/2021  ? HGBA1C 5.5 09/19/2021  ? ?Assessment/Plan:  ?Carmen Allen is a 61 y.o. White or Caucasian [1] female with  has a past medical history of Allergy, Depression, Leukocytosis, Lumbar degenerative disc disease (10/10/2012), Migraine, and No blood products (11/24/2014). ? ?B12 deficiency ?Lab Results  ?Component Value Date  ? NWGNFAOZ30 232 07/20/2020  ? ?Low, to start oral replacement - b12 1000 mcg qd ? ? ?Vitamin D deficiency ?Last vitamin D ?Lab Results  ?Component Value Date  ? VD25OH 22.72 (L) 07/20/2020  ? ?Low, to start oral replacement ? ? ?Encounter for well adult exam with abnormal findings ?Marland KitchenAge and sex appropriate education and counseling updated with regular exercise and diet ?Referrals for preventative services - decliens hiv screen ?Immunizations addressed - declines shingirx ?Smoking counseling  - none needed ?Evidence for depression or other mood disorder - none significant ?Most recent labs reviewed. ?I have personally reviewed and have noted: ?1) the patient's medical and social history ?2) The patient's current medications and supplements ?3) The patient's height, weight, and BMI have been recorded in the chart ? ? ?Upper back pain on right side ?Unusual pain hard to characterize,  but will need cxr /thoracic spine films to start, and consider sport med if not revealing, or chest CT if xray abnormal ? ?Hyperlipidemia ?Lab Results  ?Component Value Date  ? LDLCALC 135 (H)  09/19/2021  ? ?Stable, pt to continue current low chol diet, declines statin for now ? ?Followup: No follow-ups on file. ? ?Cathlean Cower, MD 09/20/2021 5:05 PM ?[facility] ?[place] ?Internal Medicine ?

## 2021-09-19 NOTE — Assessment & Plan Note (Signed)
Lab Results  ?Component Value Date  ? [id number] 232 07/20/2020  ? ?Low, to start oral replacement - b12 1000 mcg qd ? ?

## 2021-09-20 NOTE — Assessment & Plan Note (Signed)
Unusual pain hard to characterize, but will need cxr /thoracic spine films to start, and consider sport med if not revealing, or chest CT if xray abnormal ?

## 2021-09-20 NOTE — Assessment & Plan Note (Signed)
.  Age and sex appropriate education and counseling updated with regular exercise and diet ?Referrals for preventative services - decliens hiv screen ?Immunizations addressed - declines shingirx ?Smoking counseling  - none needed ?Evidence for depression or other mood disorder - none significant ?Most recent labs reviewed. ?I have personally reviewed and have noted: ?1) the patient's medical and social history ?2) The patient's current medications and supplements ?3) The patient's height, weight, and BMI have been recorded in the chart ? ?

## 2021-09-20 NOTE — Assessment & Plan Note (Signed)
Lab Results  ?Component Value Date  ? LDLCALC 135 (H) 09/19/2021  ? ?Stable, pt to continue current low chol diet, declines statin for now ? ?

## 2021-09-22 ENCOUNTER — Ambulatory Visit
Admission: RE | Admit: 2021-09-22 | Discharge: 2021-09-22 | Disposition: A | Discharge status: Home / Self Care | Payer: 59 | Source: Ambulatory Visit | Attending: Internal Medicine | Admitting: Internal Medicine

## 2021-09-22 ENCOUNTER — Inpatient Hospital Stay: Admission: RE | Admit: 2021-09-22 | Payer: 59 | Source: Ambulatory Visit

## 2021-09-22 DIAGNOSIS — R911 Solitary pulmonary nodule: Secondary | ICD-10-CM

## 2021-09-22 DIAGNOSIS — R079 Chest pain, unspecified: Secondary | ICD-10-CM | POA: Diagnosis not present

## 2021-11-25 ENCOUNTER — Encounter: Payer: Self-pay | Admitting: Internal Medicine

## 2021-11-25 DIAGNOSIS — R931 Abnormal findings on diagnostic imaging of heart and coronary circulation: Secondary | ICD-10-CM

## 2021-12-07 ENCOUNTER — Other Ambulatory Visit: Payer: Self-pay | Admitting: Internal Medicine

## 2021-12-07 DIAGNOSIS — G4700 Insomnia, unspecified: Secondary | ICD-10-CM

## 2021-12-15 NOTE — Progress Notes (Signed)
Cardiology Office Note:   Date:  12/16/2021  NAME:  Carmen Allen    MRN: [id number] DOB:  [birth year]   PCP:  Biagio Borg, MD  Cardiologist:  None  Electrophysiologist:  None   Referring MD: Biagio Borg, MD   Chief Complaint  Patient presents with   Chest Pain   History of Present Illness:   Carmen Allen is a 61 y.o. female with a hx of elevated calcium score who is being seen today for the evaluation of elevated calcium score at the request of Biagio Borg, MD. she had calcium scoring last year.  Did not take a statin until now.  Apparently her husband had a myocardial infarction in May.  He underwent bypass surgery in [place].  She reports for the past month or so she has had discomfort in her chest.  She reports she feels a burning sensation.  Can occur randomly.  Can be associated with cough.  She reports that she exercises.  She can run 1 hour 5 days/week.  No chest discomfort when running.  No association with food.  Symptoms are now alleviated by rest.  Symptoms seem to be noncardiac however she does have an elevated calcium score.  We discussed coronary CTA for further evaluation which she is interested in.  Her blood pressure is stable.  She has never had a heart attack or stroke.  She does not smoke.  No excess alcohol.  No drug use reported.  Her EKG shows normal sinus rhythm with no acute ischemic changes.  She was prescribed Lipitor 10 mg daily by her primary care physician which I believe is reasonable.  She does not need aspirin.  CV exam is normal.  Problem List Coronary calcium score 167 (94th percentile)  HLD -T chol 236, HDL 89, LDL 135, TG 58  Past Medical History: Past Medical History:  Diagnosis Date   Allergy    Depression    Leukocytosis    reports this way for as long as she can remember   Lumbar degenerative disc disease 10/10/2012   Migraine    No blood products 11/24/2014   Jehovah's witness     Past Surgical History: Past  Surgical History:  Procedure Laterality Date   ABDOMINAL HYSTERECTOMY     oophorectomy, endometriosis   COLONOSCOPY  2014    Current Medications: Current Meds  Medication Sig   cetirizine (ZYRTEC ALLERGY) 10 MG tablet Take 1 tablet (10 mg total) by mouth at bedtime.   clonazePAM (KLONOPIN) 0.5 MG tablet TAKE 1/2 TO 1 (ONE-HALF TO ONE) TABLET BY MOUTH AT BEDTIME AS NEEDED FOR ANXIETY   fluticasone (FLONASE) 50 MCG/ACT nasal spray Place 2 sprays into both nostrils daily.   ibuprofen (ADVIL) 400 MG tablet Take 1 tablet (400 mg total) by mouth every 8 (eight) hours as needed for up to 30 doses.   imipramine (TOFRANIL) 50 MG tablet TAKE 2 TABLETS BY MOUTH AT BEDTIME   metoprolol tartrate (LOPRESSOR) 100 MG tablet Take 1 tablet by mouth once for procedure.     Allergies:    Patient has no known allergies.   Social History: Social History   Socioeconomic History   Marital status: Married    Spouse name: Not on file   Number of children: 0   Years of education: Not on file   Highest education level: Not on file  Occupational History   Occupation: Office management  Tobacco Use   Smoking status: Never  Smokeless tobacco: Never  Vaping Use   Vaping Use: Never used  Substance and Sexual Activity   Alcohol use: Yes    Alcohol/week: 5.0 standard drinks of alcohol    Types: 5 Glasses of wine per week   Drug use: No   Sexual activity: Yes  Other Topics Concern   Not on file  Social History Narrative   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 There is no history of Colon cancer, Esophageal cancer, Rectal cancer, or Stomach cancer.  ROS:   All other ROS reviewed and negative. Pertinent positives noted in the HPI.     EKGs/Labs/Other Studies Reviewed:   The following studies were personally reviewed by me today:  EKG:  EKG is ordered today.  The ekg ordered today demonstrates normal sinus rhythm heart rate 89, no acute ischemic changes or evidence of infarction, and was personally reviewed by me.   Recent Labs: 09/19/2021: ALT 21; BUN 18; Creatinine, Ser 0.80; Hemoglobin 13.3; Platelets 216.0; Potassium 3.9; Sodium 137; TSH 1.93   Recent Lipid Panel    Component Value Date/Time   CHOL 236 (H) 09/19/2021 0956   TRIG 58.0 09/19/2021 0956   HDL 89.00 09/19/2021 0956   CHOLHDL 3 09/19/2021 0956   VLDL 11.6 09/19/2021 0956   LDLCALC 135 (H) 09/19/2021 0956   LDLCALC 103 (H) 03/26/2020 0729   LDLDIRECT 106.3 07/12/2011 1126    Physical Exam:   VS:  BP 114/68   Pulse 89   Ht 5' 7.5" (1.715 m)   Wt 146 lb 9.6 oz (66.5 kg)   SpO2 98%   BMI 22.62 kg/m    Wt Readings from Last 3 Encounters:  12/16/21 146 lb 9.6 oz (66.5 kg)  09/19/21 148 lb 9.6 oz (67.4 kg)  12/28/20 143 lb (64.9 kg)    General: Well nourished, well developed, in no acute distress Head: Atraumatic, normal size  Eyes: PEERLA, EOMI  Neck: Supple, no JVD Endocrine: No thryomegaly Cardiac: Normal S1, S2; RRR; no murmurs, rubs, or gallops Lungs: Clear to auscultation bilaterally, no wheezing, rhonchi or rales  Abd: Soft, nontender, no hepatomegaly  Ext: No edema, pulses 2+ Musculoskeletal: No deformities, BUE and BLE strength normal and equal Skin: Warm and dry, no rashes   Neuro: Alert and oriented to person, place, time, and situation, CNII-XII grossly intact, no focal deficits  Psych: Normal mood and affect   ASSESSMENT:   Carmen Allen is  a 61 y.o. female who presents for the following: 1. Precordial pain   2. [place] coronary artery calcium score between 100 and 400   3. Mixed hyperlipidemia     PLAN:   1. Precordial pain 2. [place] coronary artery calcium score between 100 and 400 3. Mixed hyperlipidemia -Elevated calcium score in the 94th percentile.  I agree with Lipitor 10 mg daily.  She does have high  HDL cholesterol which is good.  Would recommend her LDL cholesterol be less than 100.  I think lower goals are not needed given her high HDL cholesterol.  She does not need aspirin.  Given her symptoms of chest discomfort which are likely either acid reflux or musculoskeletal or possibly stress related given her husband's recent myocardial infarction we will proceed with coronary CTA.  She will need a BMP today.  100 mg metoprolol tartrate 2 hours before the scan.  Her EKG is nonischemic.  Her exam is normal.  I see no need for an echo.  She will see [place] yearly based on the results of her scan.  Disposition: Return in about 1 year (around 12/17/2022).  Medication Adjustments/Labs and Tests Ordered: Current medicines are reviewed at length with the patient today.  Concerns regarding medicines are outlined above.  Orders Placed This Encounter  Procedures   CT CORONARY MORPH W/CTA COR W/SCORE W/CA W/CM &/OR WO/CM   Basic metabolic panel   Meds ordered this encounter  Medications   metoprolol tartrate (LOPRESSOR) 100 MG tablet    Sig: Take 1 tablet by mouth once for procedure.    Dispense:  1 tablet    Refill:  0    Patient Instructions  Medication Instructions:  Take Metoprolol 100 mg two hours before CT when scheduled.   *If you need a refill on your cardiac medications before your next appointment, please call your pharmacy*   Lab Work: BMET today   If you have labs (blood work) drawn today and your tests are completely normal, you will receive your results only by: [place] (if you have MyChart) OR A  paper copy in the mail If you have any lab test that is abnormal or we need to change your treatment, we will call you to review the results.   Testing/Procedures: Your physician has requested that you have cardiac CT. Cardiac computed tomography (CT) is a painless test that uses an x-ray machine to take clear, detailed pictures of your heart. For further information please visit [email or web address].[email or web address]. Please follow instruction sheet as given.   Follow-Up: At [facility], you and your health needs are our priority.  As part of our continuing mission to provide you with exceptional heart care, we have created designated Provider Care Teams.  These Care Teams include your primary Cardiologist (physician) and Advanced Practice Providers (APPs -  Physician Assistants and Nurse Practitioners) who all work together to provide you with the care you need, when you need it.  We recommend signing up for the patient portal called "MyChart".  Sign up information is provided on this After Visit Summary.  MyChart is used to connect with patients for Virtual Visits (Telemedicine).  Patients are able to view lab/test results, encounter notes, upcoming appointments, etc.  Non-urgent messages can be sent to your provider as well.   To learn more about what you can do with MyChart, go to [email or web address].    Your next appointment:   12 month(s)  The format for your next appointment:   In Person  Provider:   Eleonore Chiquito, MD   Other Instructions   Your cardiac CT will be scheduled at one of the below locations:   [facility] [address] [place], [place] [address] [phone]   If scheduled at [facility], please arrive at the [facility] and Children's Entrance (Entrance C2) of [facility] 30 minutes prior to test start time. You can use the FREE valet  parking offered at entrance C (encouraged to control the heart rate for the test)  Proceed to the [facility]  Radiology Department (first floor) to check-in and test prep.  All radiology patients and guests should use entrance C2 at [facility], accessed from [facility], even though the hospital's physical address listed is [address]     Please follow these instructions carefully (unless otherwise directed):  Hold all erectile dysfunction medications at least 3 days (72 hrs) prior to test.  On the Night Before the Test: Be sure to Drink plenty of water. Do not consume any caffeinated/decaffeinated beverages or chocolate 12 hours prior to your test. Do not take any antihistamines 12 hours prior to your test.  On the Day of the Test: Drink plenty of water until 1 hour prior to the test. Do not eat any food 4 hours prior to the test. You may take your regular medications prior to the test.  Take metoprolol (Lopressor) two hours prior to test. HOLD Furosemide/Hydrochlorothiazide morning of the test. FEMALES- please wear underwire-free bra if available, avoid dresses & tight clothing  After the Test: Drink plenty of water. After receiving IV contrast, you may experience a mild flushed feeling. This is normal. On occasion, you may experience a mild rash up to 24 hours after the test. This is not dangerous. If this occurs, you can take Benadryl 25 mg and increase your fluid intake. If you experience trouble breathing, this can be serious. If it is severe call 911 IMMEDIATELY. If it is mild, please call our office. If you take any of these medications: Glipizide/Metformin, Avandament, Glucavance, please do not take 48 hours after completing test unless otherwise instructed.  We will call to schedule your test 2-4 weeks out understanding that some insurance companies will need an authorization prior to the service being performed.   For non-scheduling related questions, please contact the cardiac imaging nurse navigator should you have any questions/concerns: Marchia Bond, Cardiac Imaging Nurse Navigator Gordy Clement, Cardiac Imaging Nurse Navigator [facility] Direct Office Dial: [phone]   For scheduling needs, including cancellations and rescheduling, please call [place], [phone].           Signed, Addison Naegeli. Audie Box, MD, [place]  [address], [place], [place] [address] [phone]  12/16/2021 8:57 AM

## 2021-12-16 ENCOUNTER — Ambulatory Visit: Payer: 59 | Admitting: Cardiovascular Disease

## 2021-12-16 ENCOUNTER — Encounter: Payer: Self-pay | Admitting: Cardiovascular Disease

## 2021-12-16 VITALS — BP 114/68 | HR 89 | Ht 67.5 in | Wt 146.6 lb

## 2021-12-16 DIAGNOSIS — R931 Abnormal findings on diagnostic imaging of heart and coronary circulation: Secondary | ICD-10-CM | POA: Diagnosis not present

## 2021-12-16 DIAGNOSIS — E782 Mixed hyperlipidemia: Secondary | ICD-10-CM

## 2021-12-16 DIAGNOSIS — R072 Precordial pain: Secondary | ICD-10-CM | POA: Diagnosis not present

## 2021-12-16 LAB — BASIC METABOLIC PANEL
BUN/Creatinine Ratio: 15 (ref 12–28)
BUN: 14 mg/dL (ref 8–27)
CO2: 28 mmol/L (ref 20–29)
Calcium: 9.9 mg/dL (ref 8.7–10.3)
Chloride: 98 mmol/L (ref 96–106)
Creatinine, Ser: 0.91 mg/dL (ref 0.57–1.00)
Glucose: 106 mg/dL — ABNORMAL HIGH (ref 70–99)
Potassium: 4.7 mmol/L (ref 3.5–5.2)
Sodium: 138 mmol/L (ref 134–144)
eGFR: 72 mL/min/{1.73_m2} (ref >59)

## 2021-12-16 MED ORDER — METOPROLOL TARTRATE 100 MG PO TABS: ORAL_TABLET | ORAL | 0 refills | Status: DC

## 2021-12-16 NOTE — Patient Instructions (Addendum)
Medication Instructions:  Take Metoprolol 100 mg two hours before CT when scheduled.   *If you need a refill on your cardiac medications before your next appointment, please call your pharmacy*   Lab Work: BMET today   If you have labs (blood work) drawn today and your tests are completely normal, you will receive your results only by: [place] (if you have MyChart) OR A paper copy in the mail If you have any lab test that is abnormal or we need to change your treatment, we will call you to review the results.   Testing/Procedures: Your physician has requested that you have cardiac CT. Cardiac computed tomography (CT) is a painless test that uses an x-ray machine to take clear, detailed pictures of your heart. For further information please visit [email or web address].[email or web address]. Please follow instruction sheet as given.   Follow-Up: At [facility], you and your health needs are our priority.  As part of our continuing mission to provide you with exceptional heart care, we have created designated Provider Care Teams.  These Care Teams include your primary Cardiologist (physician) and Advanced Practice Providers (APPs -  Physician Assistants and Nurse Practitioners) who all work together to provide you with the care you need, when you need it.  We recommend signing up for the patient portal called "MyChart".  Sign up information is provided on this After Visit Summary.  MyChart is used to connect with patients for Virtual Visits (Telemedicine).  Patients are able to view lab/test results, encounter notes, upcoming appointments, etc.  Non-urgent messages can be sent to your provider as well.   To learn more about what you can do with MyChart, go to [email or web address].    Your next appointment:   12 month(s)  The format for your next appointment:   In Person  Provider:   Eleonore Chiquito, MD   Other Instructions   Your cardiac CT will be scheduled at one of the below locations:    [facility] [address] [place], [place] [address] [phone]   If scheduled at [facility], please arrive at the [facility] and Children's Entrance (Entrance C2) of [facility] 30 minutes prior to test start time. You can use the FREE valet parking offered at entrance C (encouraged to control the heart rate for the test)  Proceed to the [facility] Radiology Department (first floor) to check-in and test prep.  All radiology patients and guests should use entrance C2 at [facility], accessed from [facility], even though the hospital's physical address listed is [address].     Please follow these instructions carefully (unless otherwise directed):  Hold all erectile dysfunction medications at least 3 days (72 hrs) prior to test.  On the Night Before the Test: Be sure to Drink plenty of water. Do not consume any caffeinated/decaffeinated beverages or chocolate 12 hours prior to your test. Do not take any antihistamines 12 hours prior to your test.  On the Day of the Test: Drink plenty of water until 1 hour prior to the test. Do not eat any food 4 hours prior to the test. You may take your regular medications prior to the test.  Take metoprolol (Lopressor) two hours prior to test. HOLD Furosemide/Hydrochlorothiazide morning of the test. FEMALES- please wear underwire-free bra if available, avoid dresses & tight clothing  After the Test: Drink plenty of water. After receiving IV contrast, you may experience a mild flushed feeling. This is normal. On occasion,  you may experience a mild rash up to 24 hours after the test. This is not dangerous. If this occurs, you can take Benadryl 25 mg and increase your fluid intake. If you experience trouble breathing, this can be serious. If it is severe call 911 IMMEDIATELY. If it is mild, please call our office. If you take any of these medications: Glipizide/Metformin,  Avandament, Glucavance, please do not take 48 hours after completing test unless otherwise instructed.  We will call to schedule your test 2-4 weeks out understanding that some insurance companies will need an authorization prior to the service being performed.   For non-scheduling related questions, please contact the cardiac imaging nurse navigator should you have any questions/concerns: Marchia Bond, Cardiac Imaging Nurse Navigator Gordy Clement, Cardiac Imaging Nurse Navigator [place] [facility] Direct Office Dial: [phone]   For scheduling needs, including cancellations and rescheduling, please call [place], [phone].

## 2021-12-21 NOTE — Addendum Note (Signed)
Addended by: Caprice Beaver T on: 12/21/2021 09:41 AM   Modules accepted: Orders

## 2022-01-03 ENCOUNTER — Telehealth (HOSPITAL_COMMUNITY): Payer: Self-pay | Admitting: *Deleted

## 2022-01-03 NOTE — Telephone Encounter (Signed)
Reaching out to patient to offer assistance regarding upcoming cardiac imaging study; pt verbalizes understanding of appt date/time, parking situation and where to check in, pre-test NPO status and medications ordered, and verified current allergies; name and call back number provided for further questions should they arise  Gordy Clement RN Navigator Cardiac Imaging [facility] [phone] office [phone] cell  Patient to take '100mg'$  metoprolol tartrate two hours prior to her cardiac CT scan. She is aware to arrive at noon.

## 2022-01-04 ENCOUNTER — Ambulatory Visit (HOSPITAL_COMMUNITY)
Admission: RE | Admit: 2022-01-04 | Discharge: 2022-01-04 | Disposition: A | Discharge status: Home / Self Care | Payer: 59 | Source: Ambulatory Visit | Attending: Cardiovascular Disease | Admitting: Cardiovascular Disease

## 2022-01-04 DIAGNOSIS — R072 Precordial pain: Secondary | ICD-10-CM | POA: Insufficient documentation

## 2022-01-04 MED ORDER — IOHEXOL 350 MG/ML SOLN
100.0000 mL | Freq: Once | INTRAVENOUS | Status: AC | PRN
  Administered 2022-01-04: 100 mL via INTRAVENOUS

## 2022-01-04 MED ORDER — NITROGLYCERIN 0.4 MG SL SUBL
SUBLINGUAL_TABLET | SUBLINGUAL | Status: AC
  Filled 2022-01-04: qty 2

## 2022-01-04 MED ORDER — NITROGLYCERIN 0.4 MG SL SUBL
0.8000 mg | SUBLINGUAL_TABLET | Freq: Once | SUBLINGUAL | Status: AC
  Administered 2022-01-04: 0.8 mg via SUBLINGUAL

## 2022-01-06 ENCOUNTER — Other Ambulatory Visit: Payer: Self-pay

## 2022-01-06 DIAGNOSIS — I288 Other diseases of pulmonary vessels: Secondary | ICD-10-CM

## 2022-01-16 ENCOUNTER — Telehealth (HOSPITAL_COMMUNITY): Payer: Self-pay | Admitting: Cardiovascular Disease

## 2022-01-16 NOTE — Telephone Encounter (Signed)
01/16/22 pt cancelled  echocardiogram and does not wish to reschedule. 11:47am Order will be removed from the active echo wq. Thank you

## 2022-01-17 ENCOUNTER — Ambulatory Visit (HOSPITAL_COMMUNITY): Payer: 59

## 2022-02-14 DIAGNOSIS — M25551 Pain in right hip: Secondary | ICD-10-CM | POA: Diagnosis not present

## 2022-03-02 IMAGING — MG MM DIGITAL DIAGNOSTIC UNILAT*L* W/ TOMO W/ CAD
6 series · 6 of 18 positions shown · non-contrast
Comparison: Previous exam(s).

CLINICAL DATA: 60-year-old female for further evaluation of
possible LEFT breast asymmetry on screening mammogram.

EXAM:
DIGITAL DIAGNOSTIC UNILATERAL LEFT MAMMOGRAM WITH TOMOSYNTHESIS AND
CAD
TECHNIQUE: Left digital diagnostic mammography and breast tomosynthesis was
performed. The images were evaluated with computer-aided detection.

[L ML synth-2D]
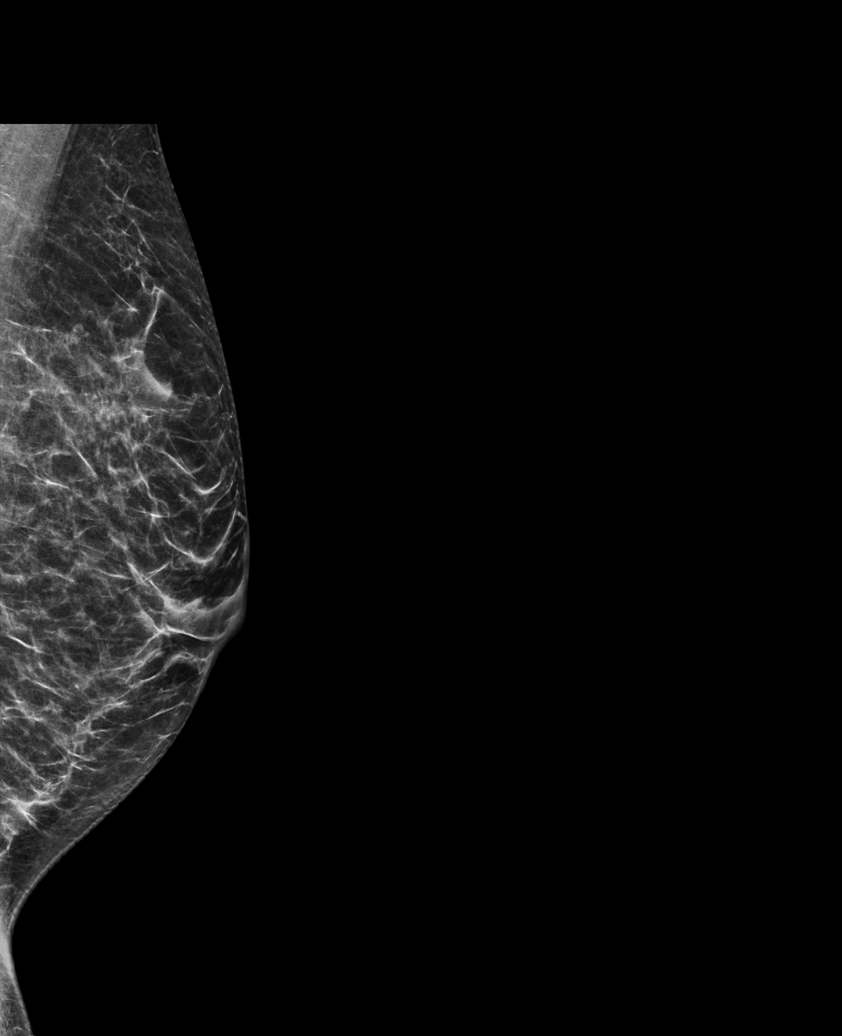

[L CC synth-2D]
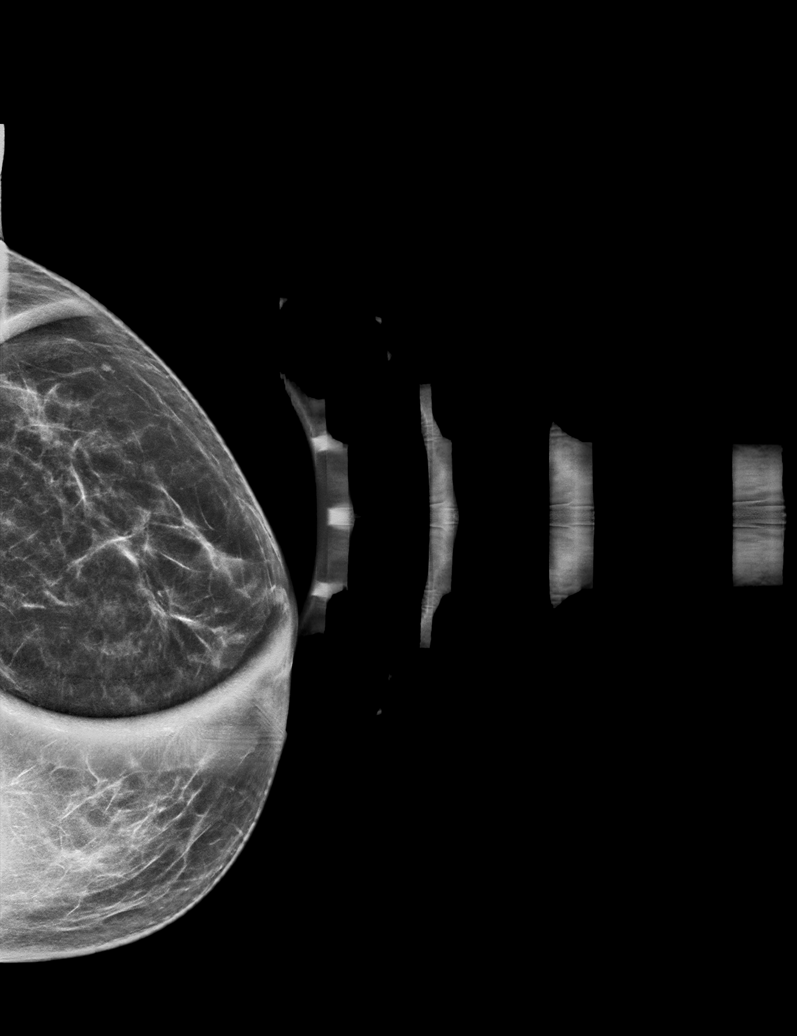

[L XCCL synth-2D]
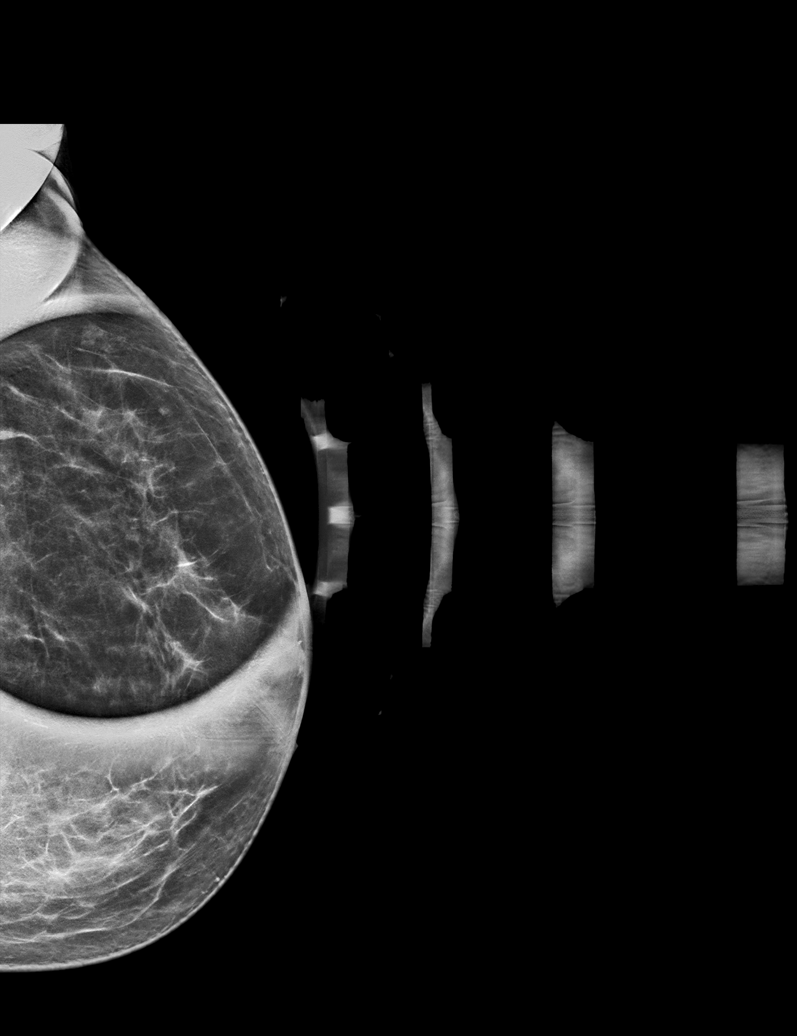

[L XCCL tomo · tomo slice 31/60.0]
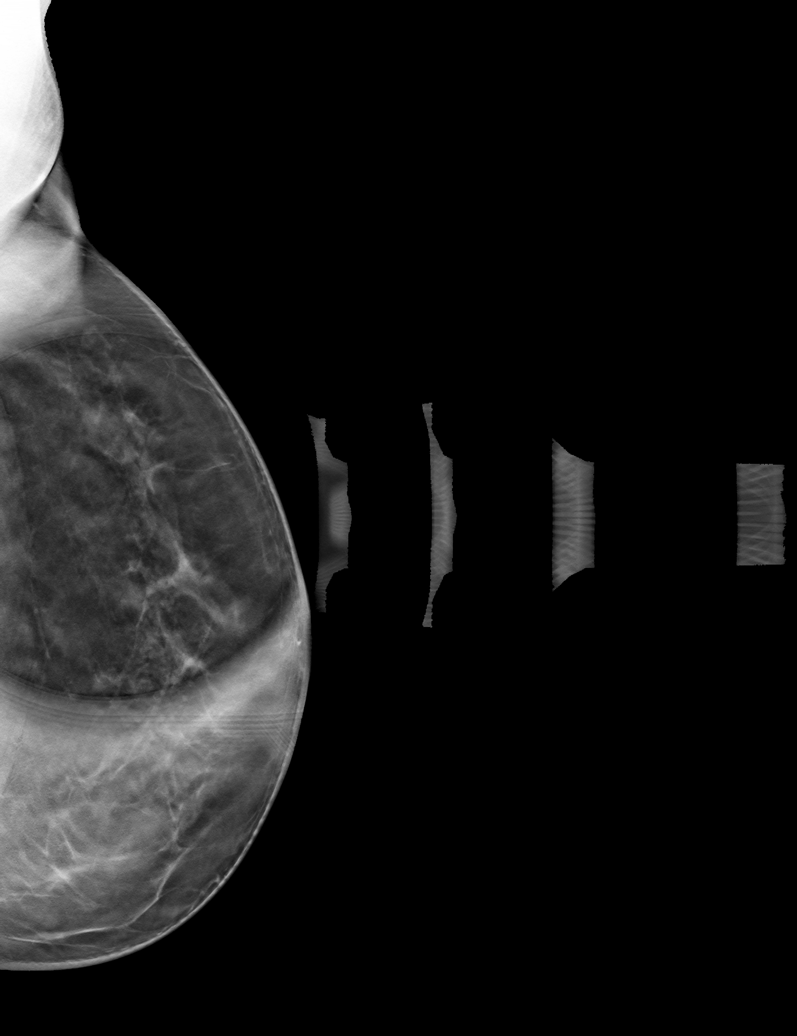

[L ML tomo · tomo slice 28/55.0]
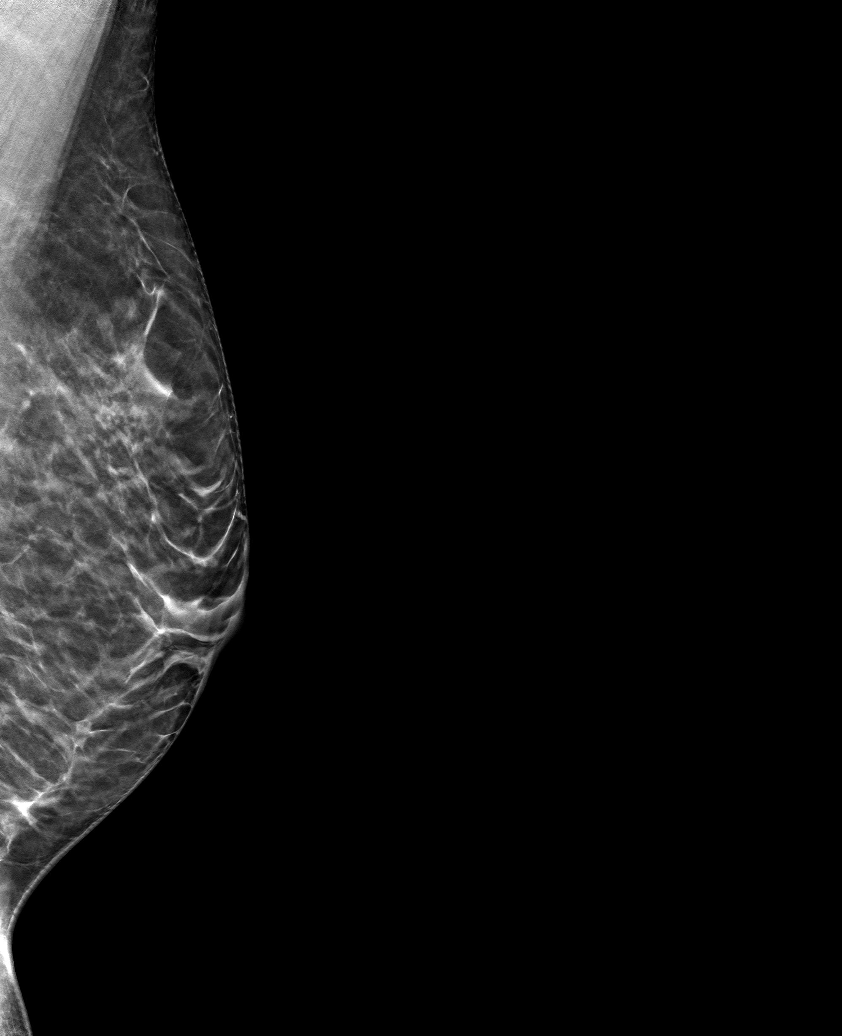

[L CC tomo · tomo slice 29/57.0]
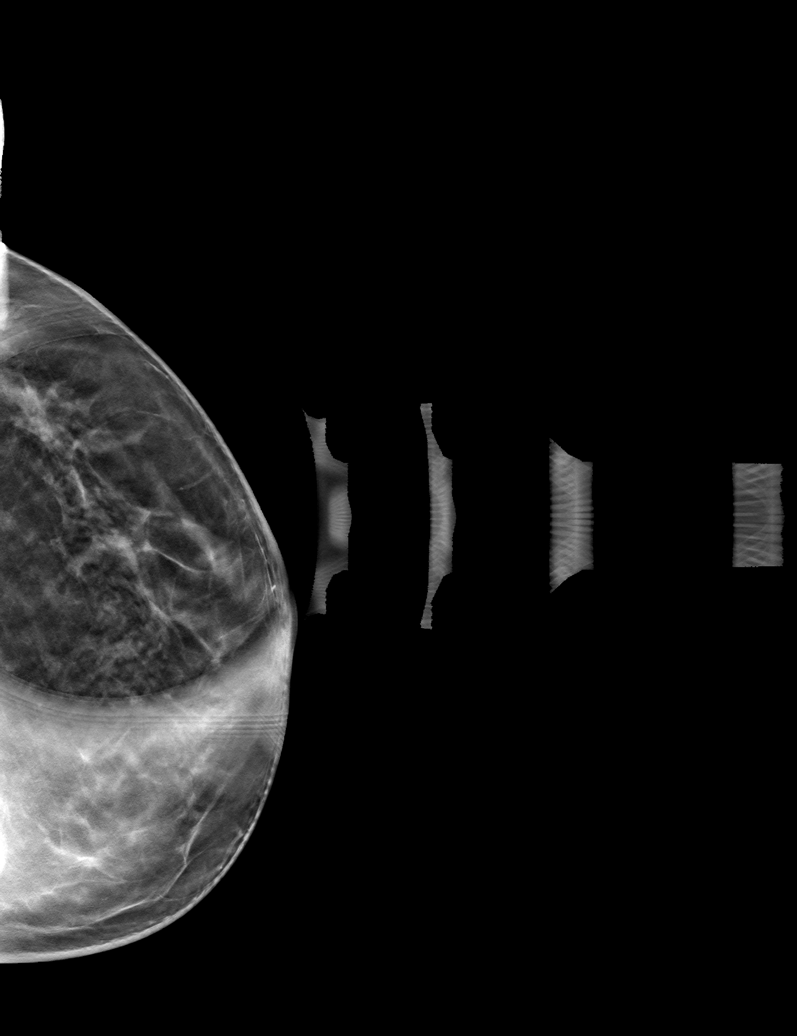

[6 of 18 positions shown; findings below may reference images not displayed]

ACR Breast Density Category b: There are scattered areas of
fibroglandular density.
FINDINGS: 2D/3D full field and spot compression views of the LEFT breast
demonstrate dispersal of the OUTER LEFT breast asymmetry without
persistent abnormality in this area.
IMPRESSION: No persistent abnormality at the site of the screening study
finding.

RECOMMENDATION:
Bilateral screening mammogram in 1 year.

I have discussed the findings and recommendations with the patient.
If applicable, a reminder letter will be sent to the patient
regarding the next appointment.

BI-RADS CATEGORY  1: Negative.

## 2022-03-23 ENCOUNTER — Ambulatory Visit
Admission: RE | Admit: 2022-03-23 | Discharge: 2022-03-23 | Disposition: A | Discharge status: Home / Self Care | Payer: 59 | Source: Ambulatory Visit | Attending: Family Medicine | Admitting: Family Medicine

## 2022-03-23 VITALS — BP 135/72 | HR 90 | Temp 98.7°F | Resp 17

## 2022-03-23 DIAGNOSIS — R519 Headache, unspecified: Secondary | ICD-10-CM | POA: Diagnosis not present

## 2022-03-23 MED ORDER — METOCLOPRAMIDE HCL 5 MG/ML IJ SOLN
10.0000 mg | INTRAMUSCULAR | Status: AC
  Administered 2022-03-23: 10 mg via INTRAMUSCULAR

## 2022-03-23 MED ORDER — RIZATRIPTAN BENZOATE 10 MG PO TABS
10.0000 mg | ORAL_TABLET | ORAL | 0 refills | Status: DC | PRN
Start: 2022-03-23 — End: 2022-10-31

## 2022-03-23 MED ORDER — KETOROLAC TROMETHAMINE 60 MG/2ML IM SOLN
60.0000 mg | Freq: Once | INTRAMUSCULAR | Status: AC
  Administered 2022-03-23: 60 mg via INTRAMUSCULAR

## 2022-03-23 NOTE — Discharge Instructions (Addendum)
Advised patient may use OTC extra strength Excedrin 1-2 tabs daily for headache.  Advised may use prescribed Maxalt daily or as needed.  Encouraged patient increase daily water intake to 64 ounces per day while taking these medications.  Advised if symptoms worsen and/or unresolved please follow-up with PCP or here for further evaluation.

## 2022-03-23 NOTE — ED Triage Notes (Signed)
Pt c/o headache and sore throat since Saturday. Denes fever. Taking flonase, ibuprofen and OTC allergy meds prn. Ibuprofen 7am today. COVID test neg at home on Saturday.

## 2022-03-23 NOTE — ED Provider Notes (Signed)
[facility]    CSN: [id number] Arrival date & time: 03/23/22  1302      History   Chief Complaint Chief Complaint  Patient presents with   Headache   Sore Throat    HPI Carmen Allen is a 61 y.o. female.   HPI 61 year old female presents with headache and sore throat since Saturday, 03/11/2022.  PMH significant for migraine, leukocytosis, and depression.  Patient describes headache as dull pounding bilateral frontal and currently rates headache pain as 4 of 10.  Rates sore throat as 2 of 10.  Past Medical History:  Diagnosis Date   Allergy    Depression    Leukocytosis    reports this way for as long as she can remember   Lumbar degenerative disc disease 10/10/2012   Migraine    No blood products 11/24/2014   Jehovah's witness     Patient Active Problem List   Diagnosis Date Noted   Upper back pain on right side 09/19/2021   B12 deficiency 09/19/2021   Osteopenia 12/30/2020   Vitamin D deficiency 12/28/2020   Encounter for well adult exam with abnormal findings 06/29/2020   Anxiety 06/29/2020   Hyperlipidemia 01/07/2019   Insomnia, unspecified 06/06/2017   No blood products 11/24/2014   Lumbar degenerative disc disease 10/10/2012   Depression, major, 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      Home Medications    Prior to Admission medications   Medication Sig Start Date End Date Taking? Authorizing Provider  rizatriptan (MAXALT) 10 MG tablet Take 1 tablet (10 mg total) by mouth as needed for migraine. May repeat in 2 hours if needed 03/23/22  Yes Eliezer Lofts, FNP  cetirizine (ZYRTEC ALLERGY) 10 MG tablet Take 1 tablet (10 mg total) by mouth at bedtime. 06/20/21 12/16/21  Lynden Oxford Scales, PA-C  clonazePAM (KLONOPIN) 0.5  MG tablet TAKE 1/2 TO 1 (ONE-HALF TO ONE) TABLET BY MOUTH AT BEDTIME AS NEEDED FOR ANXIETY 12/07/21   Biagio Borg, MD  fluticasone [facility]) 50 MCG/ACT nasal spray Place 2 sprays into both nostrils daily. 06/20/21   Lynden Oxford Scales, PA-C  ibuprofen (ADVIL) 400 MG tablet Take 1 tablet (400 mg total) by mouth every 8 (eight) hours as needed for up to 30 doses. 06/20/21   Lynden Oxford Scales, PA-C  imipramine (TOFRANIL) 50 MG tablet TAKE 2 TABLETS BY MOUTH AT BEDTIME 04/13/21   Biagio Borg, MD  metoprolol tartrate (LOPRESSOR) 100 MG tablet Take 1 tablet by mouth once for procedure. 12/16/21   O'Neal, Cassie Freer, MD    Family History Family History  Problem Relation Age of Onset   Cancer Mother    Diabetes Father    Mental illness Sister        anxiety   Mental illness Sister        ADHD   Colon cancer Neg Hx    Esophageal cancer Neg Hx    Rectal cancer Neg Hx    Stomach cancer Neg Hx     Social History Social History   Tobacco Use   Smoking status: Never   Smokeless tobacco: Never  Vaping Use   Vaping Use: Never used  Substance Use Topics   Alcohol use: Yes  Alcohol/week: 5.0 standard drinks of alcohol    Types: 5 Glasses of wine per week   Drug use: No     Allergies   Patient has no known allergies.   Review of Systems Review of Systems  HENT:  Positive for sore throat.   Neurological:  Positive for headaches.  All other systems reviewed and are negative.    Physical Exam Triage Vital Signs ED Triage Vitals [03/23/22 1310]  Enc Vitals Group     BP 135/72     Pulse Rate 90     Resp 17     Temp 98.7 F (37.1 C)     Temp Source Oral     SpO2 100 %     Weight      Height      Head Circumference      Peak Flow      Pain Score 5     Pain Loc      Pain Edu?      Excl. in [place]?    No data found.  Updated Vital Signs BP 135/72 (BP Location: Right Arm)   Pulse 90   Temp 98.7 F (37.1 C) (Oral)   Resp 17   SpO2 100%    Physical  Exam Vitals and nursing note reviewed.  Constitutional:      General: She is not in acute distress.    Appearance: Normal appearance. She is well-developed and normal weight. She is not ill-appearing.  HENT:     Head: Normocephalic and atraumatic.     Right Ear: Tympanic membrane, ear canal and external ear normal.     Left Ear: Tympanic membrane, ear canal and external ear normal.     Mouth/Throat:     Mouth: Mucous membranes are moist.     Pharynx: Oropharynx is clear. Uvula midline. No uvula swelling.  Eyes:     Extraocular Movements: Extraocular movements intact.     Conjunctiva/sclera: Conjunctivae normal.     Pupils: Pupils are equal, round, and reactive to light.  Cardiovascular:     Rate and Rhythm: Normal rate and regular rhythm.     Heart sounds: Normal heart sounds. No murmur heard. Pulmonary:     Effort: Pulmonary effort is normal.     Breath sounds: Normal breath sounds. No wheezing, rhonchi or rales.  Musculoskeletal:        General: Normal range of motion.     Cervical back: Normal range of motion and neck supple.  Lymphadenopathy:     Cervical: No cervical adenopathy.  Skin:    General: Skin is warm and dry.  Neurological:     General: No focal deficit present.     Mental Status: She is alert and oriented to person, place, and time. Mental status is at baseline.     Cranial Nerves: No cranial nerve deficit.     Motor: No weakness.      UC Treatments / Results  Labs (all labs ordered are listed, but only abnormal results are displayed) Labs Reviewed - No data to display  EKG   Radiology No results found.  Procedures Procedures (including critical care time)  Medications Ordered in UC Medications  ketorolac (TORADOL) injection 60 mg (60 mg Intramuscular Given 03/23/22 1353)  metoCLOPramide (REGLAN) injection 10 mg (10 mg Intramuscular Given 03/23/22 1353)    Initial Impression / Assessment and Plan / UC Course  I have reviewed the triage vital  signs and the nursing notes.  Pertinent labs & imaging  results that were available during my care of the patient were reviewed by me and considered in my medical decision making (see chart for details).     MDM: 1.  Bad headache-IM Toradol 60 mg, IM Reglan 10 mg given once in clinic, Rx'd Maxalt. Advised patient may use OTC extra strength Excedrin 1-2 tabs daily for headache.  Advised may use prescribed Maxalt daily or as needed.  Encouraged patient increase daily water intake to 64 ounces per day while taking these medications.  Advised if symptoms worsen and/or unresolved please follow-up with PCP or here for further evaluation.  Discharged home, hemodynamically stable. Final Clinical Impressions(s) / UC Diagnoses   Final diagnoses:  Bad headache     Discharge Instructions      Advised patient may use OTC extra strength Excedrin 1-2 tabs daily for headache.  Advised may use prescribed Maxalt daily or as needed.  Encouraged patient increase daily water intake to 64 ounces per day while taking these medications.  Advised if symptoms worsen and/or unresolved please follow-up with PCP or here for further evaluation.     ED Prescriptions     Medication Sig Dispense Auth. Provider   rizatriptan (MAXALT) 10 MG tablet Take 1 tablet (10 mg total) by mouth as needed for migraine. May repeat in 2 hours if needed 10 tablet Eliezer Lofts, FNP      PDMP not reviewed this encounter.   Eliezer Lofts, [place] 03/23/22 1404

## 2022-03-30 ENCOUNTER — Other Ambulatory Visit: Payer: Self-pay | Admitting: Internal Medicine

## 2022-03-30 DIAGNOSIS — F325 Major depressive disorder, single episode, in full remission: Secondary | ICD-10-CM

## 2022-05-16 DIAGNOSIS — M25551 Pain in right hip: Secondary | ICD-10-CM | POA: Diagnosis not present

## 2022-05-31 DIAGNOSIS — S76011D Strain of muscle, fascia and tendon of right hip, subsequent encounter: Secondary | ICD-10-CM | POA: Diagnosis not present

## 2022-05-31 DIAGNOSIS — M6281 Muscle weakness (generalized): Secondary | ICD-10-CM | POA: Diagnosis not present

## 2022-06-04 ENCOUNTER — Other Ambulatory Visit: Payer: Self-pay | Admitting: Internal Medicine

## 2022-06-04 DIAGNOSIS — G4700 Insomnia, unspecified: Secondary | ICD-10-CM

## 2022-06-07 DIAGNOSIS — S76011D Strain of muscle, fascia and tendon of right hip, subsequent encounter: Secondary | ICD-10-CM | POA: Diagnosis not present

## 2022-06-07 DIAGNOSIS — M6281 Muscle weakness (generalized): Secondary | ICD-10-CM | POA: Diagnosis not present

## 2022-06-14 DIAGNOSIS — M6281 Muscle weakness (generalized): Secondary | ICD-10-CM | POA: Diagnosis not present

## 2022-06-14 DIAGNOSIS — S76011D Strain of muscle, fascia and tendon of right hip, subsequent encounter: Secondary | ICD-10-CM | POA: Diagnosis not present

## 2022-06-21 DIAGNOSIS — S76011D Strain of muscle, fascia and tendon of right hip, subsequent encounter: Secondary | ICD-10-CM | POA: Diagnosis not present

## 2022-06-21 DIAGNOSIS — M6281 Muscle weakness (generalized): Secondary | ICD-10-CM | POA: Diagnosis not present

## 2022-06-28 DIAGNOSIS — S76011D Strain of muscle, fascia and tendon of right hip, subsequent encounter: Secondary | ICD-10-CM | POA: Diagnosis not present

## 2022-06-28 DIAGNOSIS — M6281 Muscle weakness (generalized): Secondary | ICD-10-CM | POA: Diagnosis not present

## 2022-08-14 ENCOUNTER — Ambulatory Visit
Admission: EM | Admit: 2022-08-14 | Discharge: 2022-08-14 | Disposition: A | Discharge status: Home / Self Care | Payer: 59 | Attending: Nurse Practitioner | Admitting: Nurse Practitioner

## 2022-08-14 DIAGNOSIS — B349 Viral infection, unspecified: Secondary | ICD-10-CM | POA: Insufficient documentation

## 2022-08-14 DIAGNOSIS — J029 Acute pharyngitis, unspecified: Secondary | ICD-10-CM | POA: Insufficient documentation

## 2022-08-14 LAB — POCT RAPID STREP A (OFFICE): Rapid Strep A Screen: NEGATIVE

## 2022-08-14 NOTE — Discharge Instructions (Signed)
Start Flonase daily Continue over-the-counter allergy medicine and ibuprofen or Tylenol as needed Salt water gargles and warm liquids The clinic will contact you with result of your throat culture is positive Please go to the emergency room if you have any worsening symptoms Follow-up with your PCP if your symptoms do not improve

## 2022-08-14 NOTE — ED Provider Notes (Signed)
[facility]    CSN: [id number] Arrival date & time: 08/14/22  [id number]      History   Chief Complaint Chief Complaint  Patient presents with   Sore Throat    HPI Carmen Allen is a 62 y.o. female  presents for evaluation of URI symptoms for 5 days. Patient reports associated symptoms of sore throat, runny nose, intermittent ear pain. Denies N/V/D, cough, congestion, fevers, body aches, shortness of breath. Patient does not have a hx of asthma or smoking. No known sick contacts.  Pt has taken allergy medicine and Advil OTC for symptoms with no improvement in symptoms. Pt has no other concerns at this time.    Sore Throat    Past Medical History:  Diagnosis Date   Allergy    Depression    Leukocytosis    reports this way for as long as she can remember   Lumbar degenerative disc disease 10/10/2012   Migraine    No blood products 11/24/2014   Jehovah's witness     Patient Active Problem List   Diagnosis Date Noted   Upper back pain on right side 09/19/2021   B12 deficiency 09/19/2021   Osteopenia 12/30/2020   Vitamin D deficiency 12/28/2020   Encounter for well adult exam with abnormal findings 06/29/2020   Anxiety 06/29/2020   Hyperlipidemia 01/07/2019   Insomnia, unspecified 06/06/2017   No blood products 11/24/2014   Lumbar degenerative disc disease 10/10/2012   Depression, major, 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      Home Medications    Prior to Admission medications   Medication Sig Start Date End Date Taking? Authorizing Provider  cetirizine (ZYRTEC ALLERGY) 10 MG tablet Take 1 tablet (10 mg total) by mouth at bedtime. 06/20/21 12/16/21  Lynden Oxford Scales, PA-C  clonazePAM (KLONOPIN) 0.5 MG tablet TAKE 1/2 TO 1 (ONE-HALF  TO ONE) TABLET BY MOUTH AT BEDTIME AS NEEDED FOR ANXIETY 06/04/22   Biagio Borg, MD  fluticasone [facility]) 50 MCG/ACT nasal spray Place 2 sprays into both nostrils daily. 06/20/21   Lynden Oxford Scales, PA-C  ibuprofen (ADVIL) 400 MG tablet Take 1 tablet (400 mg total) by mouth every 8 (eight) hours as needed for up to 30 doses. 06/20/21   Lynden Oxford Scales, PA-C  imipramine (TOFRANIL) 50 MG tablet TAKE 2 TABLETS BY MOUTH AT BEDTIME 03/30/22   Biagio Borg, MD  metoprolol tartrate (LOPRESSOR) 100 MG tablet Take 1 tablet by mouth once for procedure. 12/16/21   O'NealCassie Freer, MD  rizatriptan (MAXALT) 10 MG tablet Take 1 tablet (10 mg total) by mouth as needed for migraine. May repeat in 2 hours if needed 03/23/22   Eliezer Lofts, FNP    Family History Family History  Problem Relation Age of Onset   Cancer Mother    Diabetes Father    Mental illness Sister        anxiety   Mental illness Sister        ADHD   Colon cancer Neg Hx    Esophageal cancer Neg Hx    Rectal cancer Neg Hx    Stomach cancer Neg Hx     Social History Social History   Tobacco Use   Smoking status: Never  Smokeless tobacco: Never  Vaping Use   Vaping Use: Never used  Substance Use Topics   Alcohol use: Yes    Alcohol/week: 5.0 standard drinks of alcohol    Types: 5 Glasses of wine per week   Drug use: No     Allergies   Patient has no known allergies.   Review of Systems Review of Systems  HENT:  Positive for ear pain, rhinorrhea and sore throat.      Physical Exam Triage Vital Signs ED Triage Vitals  Enc Vitals Group     BP 08/14/22 1001 125/79     Pulse Rate 08/14/22 1001 86     Resp 08/14/22 1001 16     Temp 08/14/22 1001 98.2 F (36.8 C)     Temp Source 08/14/22 1001 Oral     SpO2 08/14/22 1001 98 %     Weight --      Height --      Head Circumference --      Peak Flow --      Pain Score 08/14/22 1000 5     Pain Loc --      Pain Edu? --      Excl. in [place]? --    No  data found.  Updated Vital Signs BP 125/79 (BP Location: Left Arm)   Pulse 86   Temp 98.2 F (36.8 C) (Oral)   Resp 16   SpO2 98%   Visual Acuity Right Eye Distance:   Left Eye Distance:   Bilateral Distance:    Right Eye Near:   Left Eye Near:    Bilateral Near:     Physical Exam Vitals and nursing note reviewed.  Constitutional:      General: She is not in acute distress.    Appearance: She is well-developed. She is not ill-appearing.  HENT:     Head: Normocephalic and atraumatic.     Right Ear: Tympanic membrane and ear canal normal.     Left Ear: Tympanic membrane and ear canal normal.     Nose: No congestion.     Mouth/Throat:     Mouth: Mucous membranes are moist.     Pharynx: Oropharynx is clear. Uvula midline. Posterior oropharyngeal erythema present. No oropharyngeal exudate.     Tonsils: No tonsillar exudate or tonsillar abscesses.  Eyes:     Conjunctiva/sclera: Conjunctivae normal.     Pupils: Pupils are equal, round, and reactive to light.  Cardiovascular:     Rate and Rhythm: Normal rate and regular rhythm.     Heart sounds: Normal heart sounds.  Pulmonary:     Effort: Pulmonary effort is normal.     Breath sounds: Normal breath sounds.  Musculoskeletal:     Cervical back: Normal range of motion and neck supple.  Lymphadenopathy:     Cervical: No cervical adenopathy.  Skin:    General: Skin is warm and dry.  Neurological:     General: No focal deficit present.     Mental Status: She is alert and oriented to person, place, and time.  Psychiatric:        Mood and Affect: Mood normal.        Behavior: Behavior normal.      UC Treatments / Results  Labs (all labs ordered are listed, but only abnormal results are displayed) Labs Reviewed  CULTURE, GROUP A STREP [facility])  POCT RAPID STREP A (OFFICE)    EKG   Radiology No results found.  Procedures Procedures (including  critical care time)  Medications Ordered in UC Medications - No data  to display  Initial Impression / Assessment and Plan / UC Course  I have reviewed the triage vital signs and the nursing notes.  Pertinent labs & imaging results that were available during my care of the patient were reviewed by me and considered in my medical decision making (see chart for details).     Negative rapid strep, will culture Discussed viral pharyngitis and symptomatic treatment Patient to start Flonase OTC Continue allergy medicine and OTC analgesics as needed Salt water gargles and warm liquids PCP follow-up if symptoms do not improve ER precautions reviewed and patient verbalized understanding Final Clinical Impressions(s) / UC Diagnoses   Final diagnoses:  Sore throat  Viral illness     Discharge Instructions      Start Flonase daily Continue over-the-counter allergy medicine and ibuprofen or Tylenol as needed Salt water gargles and warm liquids The clinic will contact you with result of your throat culture is positive Please go to the emergency room if you have any worsening symptoms Follow-up with your PCP if your symptoms do not improve     ED Prescriptions   None    PDMP not reviewed this encounter.   Melynda Ripple, NP 08/14/22 1114

## 2022-08-14 NOTE — ED Triage Notes (Signed)
Pt c/o sore throat x 5 days.  Home interventions: Advil and allergy medication

## 2022-08-17 LAB — CULTURE, GROUP A STREP (THRC)

## 2022-09-20 ENCOUNTER — Other Ambulatory Visit: Payer: Self-pay | Admitting: Internal Medicine

## 2022-09-20 DIAGNOSIS — F325 Major depressive disorder, single episode, in full remission: Secondary | ICD-10-CM

## 2022-09-22 ENCOUNTER — Encounter: Payer: 59 | Admitting: Internal Medicine

## 2022-09-27 ENCOUNTER — Other Ambulatory Visit: Payer: Self-pay

## 2022-09-27 ENCOUNTER — Telehealth: Payer: Self-pay | Admitting: Internal Medicine

## 2022-09-27 NOTE — Telephone Encounter (Signed)
Prescription Request  09/27/2022  LOV: 09/19/2021  What is the name of the medication or equipment?  imipramine (TOFRANIL) 50 MG tablet    Have you contacted your pharmacy to request a refill? Yes   Which pharmacy would you like this sent to?  [facility] [address] [place], [place] - [address] [place] [address] Phone: [phone] Fax: [phone]    Patient notified that their request is being sent to the clinical staff for review and that they should receive a response within 2 business days.   Please advise at Mobile [phone] (mobile)    Next OV is 10/11/2022.

## 2022-09-27 NOTE — Telephone Encounter (Signed)
Refill sent in today

## 2022-10-11 ENCOUNTER — Encounter: Payer: 59 | Admitting: Internal Medicine

## 2022-10-22 ENCOUNTER — Other Ambulatory Visit: Payer: Self-pay | Admitting: Internal Medicine

## 2022-10-22 DIAGNOSIS — F325 Major depressive disorder, single episode, in full remission: Secondary | ICD-10-CM

## 2022-10-31 ENCOUNTER — Ambulatory Visit (INDEPENDENT_AMBULATORY_CARE_PROVIDER_SITE_OTHER): Payer: 59 | Admitting: Internal Medicine

## 2022-10-31 VITALS — BP 118/78 | HR 83 | Temp 98.0°F | Ht 67.5 in | Wt 149.0 lb

## 2022-10-31 DIAGNOSIS — G4700 Insomnia, unspecified: Secondary | ICD-10-CM

## 2022-10-31 DIAGNOSIS — F325 Major depressive disorder, single episode, in full remission: Secondary | ICD-10-CM

## 2022-10-31 DIAGNOSIS — E538 Deficiency of other specified B group vitamins: Secondary | ICD-10-CM | POA: Diagnosis not present

## 2022-10-31 DIAGNOSIS — R739 Hyperglycemia, unspecified: Secondary | ICD-10-CM | POA: Diagnosis not present

## 2022-10-31 DIAGNOSIS — E7800 Pure hypercholesterolemia, unspecified: Secondary | ICD-10-CM

## 2022-10-31 DIAGNOSIS — Z0001 Encounter for general adult medical examination with abnormal findings: Secondary | ICD-10-CM

## 2022-10-31 DIAGNOSIS — Z0000 Encounter for general adult medical examination without abnormal findings: Secondary | ICD-10-CM | POA: Diagnosis not present

## 2022-10-31 DIAGNOSIS — E559 Vitamin D deficiency, unspecified: Secondary | ICD-10-CM | POA: Diagnosis not present

## 2022-10-31 DIAGNOSIS — F419 Anxiety disorder, unspecified: Secondary | ICD-10-CM | POA: Diagnosis not present

## 2022-10-31 LAB — VITAMIN D 25 HYDROXY (VIT D DEFICIENCY, FRACTURES): VITD: 39.08 ng/mL (ref 30.00–100.00)

## 2022-10-31 LAB — LIPID PANEL
Cholesterol: 236 mg/dL — ABNORMAL HIGH (ref 0–200)
HDL: 93 mg/dL (ref >39.00)
LDL Cholesterol: 123 mg/dL — ABNORMAL HIGH (ref 0–99)
NonHDL: 143.2
Total CHOL/HDL Ratio: 3
Triglycerides: 99 mg/dL (ref 0.0–149.0)
VLDL: 19.8 mg/dL (ref 0.0–40.0)

## 2022-10-31 LAB — HEPATIC FUNCTION PANEL
ALT: 14 U/L (ref 0–35)
AST: 22 U/L (ref 0–37)
Albumin: 4.4 g/dL (ref 3.5–5.2)
Alkaline Phosphatase: 35 U/L — ABNORMAL LOW (ref 39–117)
Bilirubin, Direct: 0.2 mg/dL (ref 0.0–0.3)
Total Bilirubin: 0.9 mg/dL (ref 0.2–1.2)
Total Protein: 7 g/dL (ref 6.0–8.3)

## 2022-10-31 LAB — BASIC METABOLIC PANEL
BUN: 18 mg/dL (ref 6–23)
CO2: 32 mEq/L (ref 19–32)
Calcium: 9.5 mg/dL (ref 8.4–10.5)
Chloride: 98 mEq/L (ref 96–112)
Creatinine, Ser: 0.82 mg/dL (ref 0.40–1.20)
GFR: 77.05 mL/min (ref >60.00)
Glucose, Bld: 101 mg/dL — ABNORMAL HIGH (ref 70–99)
Potassium: 3.9 mEq/L (ref 3.5–5.1)
Sodium: 138 mEq/L (ref 135–145)

## 2022-10-31 LAB — URINALYSIS, ROUTINE W REFLEX MICROSCOPIC
Bilirubin Urine: NEGATIVE
Hgb urine dipstick: NEGATIVE
Ketones, ur: NEGATIVE
Leukocytes,Ua: NEGATIVE
Nitrite: NEGATIVE
Specific Gravity, Urine: <=1.005 — AB (ref 1.000–1.030)
Total Protein, Urine: NEGATIVE
Urine Glucose: NEGATIVE
Urobilinogen, UA: 0.2 (ref 0.0–1.0)
pH: 6.5 (ref 5.0–8.0)

## 2022-10-31 LAB — CBC WITH DIFFERENTIAL/PLATELET
Basophils Absolute: 0 10*3/uL (ref 0.0–0.1)
Basophils Relative: 0.4 % (ref 0.0–3.0)
Eosinophils Absolute: 0.1 10*3/uL (ref 0.0–0.7)
Eosinophils Relative: 2.7 % (ref 0.0–5.0)
HCT: 39 % (ref 36.0–46.0)
Hemoglobin: 13.1 g/dL (ref 12.0–15.0)
Lymphocytes Relative: 40.6 % (ref 12.0–46.0)
Lymphs Abs: 1.4 10*3/uL (ref 0.7–4.0)
MCHC: 33.4 g/dL (ref 30.0–36.0)
MCV: 98.7 fl (ref 78.0–100.0)
Monocytes Absolute: 0.3 10*3/uL (ref 0.1–1.0)
Monocytes Relative: 9 % (ref 3.0–12.0)
Neutro Abs: 1.7 10*3/uL (ref 1.4–7.7)
Neutrophils Relative %: 47.3 % (ref 43.0–77.0)
Platelets: 215 10*3/uL (ref 150.0–400.0)
RBC: 3.96 Mil/uL (ref 3.87–5.11)
RDW: 12.6 % (ref 11.5–15.5)
WBC: 3.6 10*3/uL — ABNORMAL LOW (ref 4.0–10.5)

## 2022-10-31 LAB — TSH: TSH: 2.61 u[IU]/mL (ref 0.35–5.50)

## 2022-10-31 LAB — VITAMIN B12: Vitamin B-12: 204 pg/mL — ABNORMAL LOW (ref 211–911)

## 2022-10-31 LAB — HEMOGLOBIN A1C: Hgb A1c MFr Bld: 5.3 % (ref 4.6–6.5)

## 2022-10-31 MED ORDER — REPATHA SURECLICK 140 MG/ML ~~LOC~~ SOAJ: 140.0000 mg | SUBCUTANEOUS | 3 refills | Status: DC

## 2022-10-31 MED ORDER — CLONAZEPAM 0.5 MG PO TABS: ORAL_TABLET | ORAL | 1 refills | Status: DC

## 2022-10-31 NOTE — Progress Notes (Unsigned)
Patient ID: Carmen Allen, female   DOB: January 14, 1961, 62 y.o.   MRN: [id number]         Chief Complaint:: wellness exam and anxiety depression, hld, low vit d, low b12, insomnia       HPI:  Carmen Allen is a 62 y.o. female here for wellness exam; for shingrix and tdap at the pharmacy, plans to call herself for mammogram, o/w up to date               Also Pt denies chest pain, increased sob or doe, wheezing, orthopnea, PND, increased LE swelling, palpitations, dizziness or syncope.   Pt denies polydipsia, polyuria, or new focal neuro s/s.    Pt denies fever, wt loss, night sweats, loss of appetite, or other constitutional symptoms  Denies worsening depressive symptoms, suicidal ideation, or panic; has ongoing anxiety, with recently worsening stressors related to just about to quit her job of many years and husband heart illness recent.  Pt does not want statin as husband has not been able to tolerate well. Has had mild worsneing insomnia as well.     Wt Readings from Last 3 Encounters:  10/31/22 149 lb (67.6 kg)  12/16/21 146 lb 9.6 oz (66.5 kg)  09/19/21 148 lb 9.6 oz (67.4 kg)   BP Readings from Last 3 Encounters:  10/31/22 118/78  08/14/22 125/79  03/23/22 135/72   Immunization History  Administered Date(s) Administered   PFIZER(Purple Top)SARS-COV-2 Vaccination 08/14/2019, 09/09/2019, 05/20/2020   Tdap 10/10/2012   Health Maintenance Due  Topic Date Due   Zoster Vaccines- Shingrix (1 of 2) Never done   MAMMOGRAM  05/12/2022   DTaP/Tdap/Td (2 - Td or Tdap) 10/11/2022      Past Medical History:  Diagnosis Date   Allergy    Depression    Leukocytosis    reports this way for as long as she can remember   Lumbar degenerative disc disease 10/10/2012   Migraine    No blood products 11/24/2014   Jehovah's witness    Past Surgical History:  Procedure Laterality Date   ABDOMINAL HYSTERECTOMY     oophorectomy, endometriosis   COLONOSCOPY  2014    reports that she has never  smoked. She has never used smokeless tobacco. She reports current alcohol use of about 5.0 standard drinks of alcohol per week. She reports that she does not use drugs. family history includes Cancer in her mother; Diabetes in her father; Mental illness in her sister and sister. No Known Allergies Current Outpatient Medications on File Prior to Visit  Medication Sig Dispense Refill   fluticasone (FLONASE) 50 MCG/ACT nasal spray Place 2 sprays into both nostrils daily. 18 mL 0   ibuprofen (ADVIL) 400 MG tablet Take 1 tablet (400 mg total) by mouth every 8 (eight) hours as needed for up to 30 doses. 30 tablet 0   imipramine (TOFRANIL) 50 MG tablet TAKE 2 TABLETS BY MOUTH AT BEDTIME 60 tablet 3   cetirizine (ZYRTEC ALLERGY) 10 MG tablet Take 1 tablet (10 mg total) by mouth at bedtime. 30 tablet 2   No current facility-administered medications on file prior to visit.        ROS:  All others reviewed and negative.  Objective        PE:  BP 118/78 (BP Location: Left Arm, Patient Position: Sitting, Cuff Size: Normal)   Pulse 83   Temp 98 F (36.7 C) (Oral)   Ht 5' 7.5" (1.715 m)   Wt 149 lb (  67.6 kg)   SpO2 98%   BMI 22.99 kg/m                 Constitutional: Pt appears in NAD               HENT: Head: NCAT.                Right Ear: External ear normal.                 Left Ear: External ear normal.                Eyes: . Pupils are equal, round, and reactive to light. Conjunctivae and EOM are normal               Nose: without d/c or deformity               Neck: Neck supple. Gross normal ROM               Cardiovascular: Normal rate and regular rhythm.                 Pulmonary/Chest: Effort normal and breath sounds without rales or wheezing.                Abd:  Soft, NT, ND, + BS, no organomegaly               Neurological: Pt is alert. At baseline orientation, motor grossly intact               Skin: Skin is warm. No rashes, no other new lesions, LE edema - none                Psychiatric: Pt behavior is normal without agitation   Micro: none  Cardiac tracings I have personally interpreted today:  none  Pertinent Radiological findings (summarize): none   Lab Results  Component Value Date   WBC 3.6 (L) 10/31/2022   HGB 13.1 10/31/2022   HCT 39.0 10/31/2022   PLT 215.0 10/31/2022   GLUCOSE 101 (H) 10/31/2022   CHOL 236 (H) 10/31/2022   TRIG 99.0 10/31/2022   HDL 93.00 10/31/2022   LDLDIRECT 106.3 07/12/2011   LDLCALC 123 (H) 10/31/2022   ALT 14 10/31/2022   AST 22 10/31/2022   NA 138 10/31/2022   K 3.9 10/31/2022   CL 98 10/31/2022   CREATININE 0.82 10/31/2022   BUN 18 10/31/2022   CO2 32 10/31/2022   TSH 2.61 10/31/2022   HGBA1C 5.3 10/31/2022   Assessment/Plan:  Carmen Allen is a 62 y.o. White or Caucasian [1] female with  has a past medical history of Allergy, Depression, Leukocytosis, Lumbar degenerative disc disease (10/10/2012), Migraine, and No blood products (11/24/2014).  Encounter for well adult exam with abnormal findings Age and sex appropriate education and counseling updated with regular exercise and diet Referrals for preventative services - pt to call herself for mammogram Immunizations addressed - for shingrix and tdap at the pharmacy Smoking counseling  - none needed Evidence for depression or other mood disorder - worsening stressrs recently, declines need for counseling referral Most recent labs reviewed. I have personally reviewed and have noted: 1) the patient's medical and social history 2) The patient's current medications and supplements 3) The patient's height, weight, and BMI have been recorded in the chart   Depression, major, in remission ([facility]) Overall stable, no SI or HI, declines SSRI  Hyperlipidemia Lab Results  Component Value Date  LDLCALC 123 (H) 10/31/2022   Uncontrolled given elevated cardiac cT score, declines statin, pt to start repatha 140 q 2 wks   Anxiety Stable, for klnopin bid prn  continue  Vitamin D deficiency Last vitamin D Lab Results  Component Value Date   VD25OH 39.08 10/31/2022   Low, to start oral replacement   B12 deficiency Lab Results  Component Value Date   VITAMINB12 204 (L) 10/31/2022   Low, to start oral replacement - b12 1000 mcg qd   Insomnia, unspecified Ok for qhs prn melatonin up to 10 mg  Followup: No follow-ups on file.  Oliver Barre, MD 11/02/2022 8:32 PM [facility] Internal [facility]

## 2022-10-31 NOTE — Patient Instructions (Addendum)
Please have your Shingrix (shingles) shots done at your local pharmacy, and the Tdap at the pharmacy  Please call [facility] for your yearly mammogram  Please take all new medication as prescribed - the repatha 140 mg every 2 wks for cholesterol  Please continue all other medications as before, and refills have been done if requested.  Please have the pharmacy call with any other refills you may need.  Please continue your efforts at being more active, low cholesterol diet, and weight control.  You are otherwise up to date with prevention measures today.  Please keep your appointments with your specialists as you may have planned  Please go to the LAB at the blood drawing area for the tests to be done  You will be contacted by phone if any changes need to be made immediately.  Otherwise, you will receive a letter about your results with an explanation, but please check with MyChart first.  Please remember to sign up for MyChart if you have not done so, as this will be important to you in the future with finding out test results, communicating by private email, and scheduling acute appointments online when needed.  Please make an Appointment to return for your 1 year visit, or sooner if needed

## 2022-11-02 ENCOUNTER — Encounter: Payer: Self-pay | Admitting: Internal Medicine

## 2022-11-02 NOTE — Assessment & Plan Note (Signed)
Stable, for klnopin bid prn continue

## 2022-11-02 NOTE — Assessment & Plan Note (Signed)
Last vitamin D Lab Results  Component Value Date   VD25OH 39.08 10/31/2022   Low, to start oral replacement

## 2022-11-02 NOTE — Assessment & Plan Note (Signed)
Age and sex appropriate education and counseling updated with regular exercise and diet Referrals for preventative services - pt to call herself for mammogram Immunizations addressed - for shingrix and tdap at the pharmacy Smoking counseling  - none needed Evidence for depression or other mood disorder - worsening stressrs recently, declines need for counseling referral Most recent labs reviewed. I have personally reviewed and have noted: 1) the patient's medical and social history 2) The patient's current medications and supplements 3) The patient's height, weight, and BMI have been recorded in the chart

## 2022-11-02 NOTE — Assessment & Plan Note (Signed)
Overall stable, no SI or HI, declines SSRI

## 2022-11-02 NOTE — Assessment & Plan Note (Signed)
Lab Results  Component Value Date   LDLCALC 123 (H) 10/31/2022   Uncontrolled given elevated cardiac cT score, declines statin, pt to start repatha 140 q 2 wks

## 2022-11-02 NOTE — Assessment & Plan Note (Signed)
Ok for qhs prn melatonin up to 10 mg

## 2022-11-02 NOTE — Assessment & Plan Note (Signed)
Lab Results  Component Value Date   VITAMINB12 204 (L) 10/31/2022   Low, to start oral replacement - b12 1000 mcg qd

## 2022-11-06 ENCOUNTER — Telehealth: Payer: Self-pay | Admitting: Pharmacy Technician

## 2022-11-06 NOTE — Telephone Encounter (Signed)
Patient Advocate Encounter   Received notification that prior authorization for Repatha SureClick 140MG /ML auto-injectors is required.   PA submitted on 11/06/2022 [employer] Electronic PA Form  Status is pending

## 2022-11-07 NOTE — Telephone Encounter (Signed)
PA denied. Denial letter attached to charts

## 2022-11-27 ENCOUNTER — Other Ambulatory Visit: Payer: Self-pay | Admitting: Internal Medicine

## 2022-11-27 DIAGNOSIS — G4700 Insomnia, unspecified: Secondary | ICD-10-CM

## 2022-12-17 DIAGNOSIS — J00 Acute nasopharyngitis [common cold]: Secondary | ICD-10-CM | POA: Diagnosis not present

## 2022-12-17 DIAGNOSIS — Z6822 Body mass index (BMI) 22.0-22.9, adult: Secondary | ICD-10-CM | POA: Diagnosis not present

## 2022-12-17 DIAGNOSIS — J029 Acute pharyngitis, unspecified: Secondary | ICD-10-CM | POA: Diagnosis not present

## 2023-01-16 ENCOUNTER — Ambulatory Visit (INDEPENDENT_AMBULATORY_CARE_PROVIDER_SITE_OTHER): Payer: 59 | Admitting: Internal Medicine

## 2023-01-16 ENCOUNTER — Encounter: Payer: Self-pay | Admitting: Internal Medicine

## 2023-01-16 ENCOUNTER — Ambulatory Visit (INDEPENDENT_AMBULATORY_CARE_PROVIDER_SITE_OTHER): Payer: 59

## 2023-01-16 VITALS — BP 138/88 | HR 90 | Temp 98.8°F | Ht 67.5 in

## 2023-01-16 DIAGNOSIS — R051 Acute cough: Secondary | ICD-10-CM | POA: Diagnosis not present

## 2023-01-16 DIAGNOSIS — J301 Allergic rhinitis due to pollen: Secondary | ICD-10-CM | POA: Diagnosis not present

## 2023-01-16 DIAGNOSIS — E559 Vitamin D deficiency, unspecified: Secondary | ICD-10-CM | POA: Diagnosis not present

## 2023-01-16 DIAGNOSIS — R053 Chronic cough: Secondary | ICD-10-CM | POA: Diagnosis not present

## 2023-01-16 DIAGNOSIS — E538 Deficiency of other specified B group vitamins: Secondary | ICD-10-CM

## 2023-01-16 LAB — POC COVID19 BINAXNOW: SARS Coronavirus 2 Ag: NEGATIVE

## 2023-01-16 MED ORDER — HYDROCODONE BIT-HOMATROP MBR 5-1.5 MG/5ML PO SOLN: 5.0000 mL | Freq: Four times a day (QID) | ORAL | 0 refills | Status: AC | PRN

## 2023-01-16 MED ORDER — AZITHROMYCIN 250 MG PO TABS: ORAL_TABLET | ORAL | 1 refills | Status: AC

## 2023-01-16 MED ORDER — PREDNISONE 10 MG PO TABS: ORAL_TABLET | ORAL | 0 refills | Status: DC

## 2023-01-16 NOTE — Patient Instructions (Addendum)
Your COVID testing was done today- Negative  Please take all new medication as prescribed - the antibiotic, and cough medicine  I also sent a small prescription of prednisone in case you wish to take this for the allergy part  Please continue all other medications as before, and refills have been done if requested.  Please have the pharmacy call with any other refills you may need.  Please keep your appointments with your specialists as you may have planned  Please go to the XRAY Department in the first floor for the x-ray testing  You will be contacted by phone if any changes need to be made immediately.  Otherwise, you will receive a letter about your results with an explanation, but please check with MyChart first.

## 2023-01-16 NOTE — Progress Notes (Signed)
Patient ID: Carmen Allen, female   DOB: 06-18-1960, 62 y.o.   MRN: [id number]        Chief Complaint: follow up uri symptoms and cough, allergies, low b12, low vit d       HPI:  Carmen Allen is a 62 y.o. female  Here with 2-3 days acute onset fever, facial pain, pressure, headache, general weakness and malaise, and greenish d/c, with mild ST and cough, but pt denies chest pain, wheezing, increased sob or doe, orthopnea, PND, increased LE swelling, palpitations, dizziness or syncope.  Does have several months ongoing nasal allergy symptoms with clearish congestion, itch and sneezing, without fever, pain, ST, swelling or wheezing.        Wt Readings from Last 3 Encounters:  10/31/22 149 lb (67.6 kg)  12/16/21 146 lb 9.6 oz (66.5 kg)  09/19/21 148 lb 9.6 oz (67.4 kg)   BP Readings from Last 3 Encounters:  01/16/23 138/88  10/31/22 118/78  08/14/22 125/79         Past Medical History:  Diagnosis Date   Allergy    Depression    Leukocytosis    reports this way for as long as she can remember   Lumbar degenerative disc disease 10/10/2012   Migraine    No blood products 11/24/2014   Jehovah's witness    Past Surgical History:  Procedure Laterality Date   ABDOMINAL HYSTERECTOMY     oophorectomy, endometriosis   COLONOSCOPY  2014    reports that she has never smoked. She has never used smokeless tobacco. She reports current alcohol use of about 5.0 standard drinks of alcohol per week. She reports that she does not use drugs. family history includes Cancer in her mother; Diabetes in her father; Mental illness in her sister and sister. No Known Allergies Current Outpatient Medications on File Prior to Visit  Medication Sig Dispense Refill   clonazePAM (KLONOPIN) 0.5 MG tablet TAKE 1/2 TO 1 (ONE-HALF TO ONE) TABLET BY MOUTH EVERY DAY AT BEDTIME AS NEEDED FOR ANXIETY 90 tablet 2   Evolocumab (REPATHA SURECLICK) 140 MG/ML SOAJ Inject 140 mg into the skin every 14 (fourteen) days. 6 mL 3    fluticasone (FLONASE) 50 MCG/ACT nasal spray Place 2 sprays into both nostrils daily. 18 mL 0   ibuprofen (ADVIL) 400 MG tablet Take 1 tablet (400 mg total) by mouth every 8 (eight) hours as needed for up to 30 doses. 30 tablet 0   imipramine (TOFRANIL) 50 MG tablet TAKE 2 TABLETS BY MOUTH AT BEDTIME 60 tablet 3   cetirizine (ZYRTEC ALLERGY) 10 MG tablet Take 1 tablet (10 mg total) by mouth at bedtime. 30 tablet 2   No current facility-administered medications on file prior to visit.        ROS:  All others reviewed and negative.  Objective        PE:  BP 138/88 (BP Location: Right Arm, Patient Position: Sitting, Cuff Size: Normal)   Pulse 90   Temp 98.8 F (37.1 C) (Oral)   Ht 5' 7.5" (1.715 m)   SpO2 98%   BMI 22.99 kg/m                 Constitutional: Pt appears in NAD               HENT: Head: NCAT.                Right Ear: External ear normal.  Left Ear: External ear normal. Bilat tm's with mild erythema.  Max sinus areas mild tender.  Pharynx with mild erythema, no exudate                Eyes: . Pupils are equal, round, and reactive to light. Conjunctivae and EOM are normal               Nose: without d/c or deformity               Neck: Neck supple. Gross normal ROM               Cardiovascular: Normal rate and regular rhythm.                 Pulmonary/Chest: Effort normal and breath sounds without rales or wheezing.                               Neurological: Pt is alert. At baseline orientation, motor grossly intact               Skin: Skin is warm. No rashes, no other new lesions, LE edema - none               Psychiatric: Pt behavior is normal without agitation   Micro: none  Cardiac tracings I have personally interpreted today:  none  Pertinent Radiological findings (summarize): none   Lab Results  Component Value Date   WBC 3.6 (L) 10/31/2022   HGB 13.1 10/31/2022   HCT 39.0 10/31/2022   PLT 215.0 10/31/2022   GLUCOSE 101 (H) 10/31/2022    CHOL 236 (H) 10/31/2022   TRIG 99.0 10/31/2022   HDL 93.00 10/31/2022   LDLDIRECT 106.3 07/12/2011   LDLCALC 123 (H) 10/31/2022   ALT 14 10/31/2022   AST 22 10/31/2022   NA 138 10/31/2022   K 3.9 10/31/2022   CL 98 10/31/2022   CREATININE 0.82 10/31/2022   BUN 18 10/31/2022   CO2 32 10/31/2022   TSH 2.61 10/31/2022   HGBA1C 5.3 10/31/2022   Assessment/Plan:  Carmen Allen is a 62 y.o. White or Caucasian [1] female with  has a past medical history of Allergy, Depression, Leukocytosis, Lumbar degenerative disc disease (10/10/2012), Migraine, and No blood products (11/24/2014).  Acute cough Mild to mod, covid neg, for cxr, for antibx course, cough med prn, to f/u any worsening symptoms or concerns  Allergic rhinitis due to pollen Mild to mod, for restart flonase and zyrtec prn,,  to f/u any worsening symptoms or concerns  B12 deficiency Lab Results  Component Value Date   VITAMINB12 204 (L) 10/31/2022   Low, to start oral replacement - b12 1000 mcg qd   Vitamin D deficiency Last vitamin D Lab Results  Component Value Date   VD25OH 39.08 10/31/2022   Low, to start oral replacement  Followup: Return if symptoms worsen or fail to improve.  Oliver Barre, MD 01/19/2023 8:45 PM [facility]

## 2023-01-19 ENCOUNTER — Encounter: Payer: Self-pay | Admitting: Internal Medicine

## 2023-01-19 DIAGNOSIS — R051 Acute cough: Secondary | ICD-10-CM | POA: Insufficient documentation

## 2023-01-19 NOTE — Assessment & Plan Note (Signed)
Lab Results  Component Value Date   VITAMINB12 204 (L) 10/31/2022   Low, to start oral replacement - b12 1000 mcg qd

## 2023-01-19 NOTE — Assessment & Plan Note (Signed)
Mild to mod, covid neg, for cxr, for antibx course, cough med prn, to f/u any worsening symptoms or concerns

## 2023-01-19 NOTE — Assessment & Plan Note (Signed)
Mild to mod, for restart flonase and zyrtec prn,,  to f/u any worsening symptoms or concerns

## 2023-01-19 NOTE — Assessment & Plan Note (Signed)
Last vitamin D Lab Results  Component Value Date   VD25OH 39.08 10/31/2022   Low, to start oral replacement

## 2023-02-14 ENCOUNTER — Other Ambulatory Visit: Payer: Self-pay

## 2023-02-14 ENCOUNTER — Other Ambulatory Visit: Payer: Self-pay | Admitting: Internal Medicine

## 2023-02-14 DIAGNOSIS — F325 Major depressive disorder, single episode, in full remission: Secondary | ICD-10-CM

## 2023-04-23 ENCOUNTER — Telehealth: Payer: Self-pay | Admitting: Internal Medicine

## 2023-04-23 DIAGNOSIS — G4700 Insomnia, unspecified: Secondary | ICD-10-CM

## 2023-04-23 NOTE — Telephone Encounter (Signed)
Prescription Request  04/23/2023  LOV: 01/16/2023  What is the name of the medication or equipment? clonazapam  Have you contacted your pharmacy to request a refill? Yes   Which pharmacy would you like this sent to?  [facility] [address] [place], [place] - [address] [place] [address] Phone: [phone] Fax: [phone]   Patient notified that their request is being sent to the clinical staff for review and that they should receive a response within 2 business days.   Please advise at Mobile [phone] (mobile)

## 2023-04-23 NOTE — Telephone Encounter (Signed)
Refill too soon , Pt has refills at the [facility] on [address].

## 2023-04-24 MED ORDER — CLONAZEPAM 0.5 MG PO TABS: ORAL_TABLET | ORAL | 2 refills | Status: DC

## 2023-04-24 NOTE — Telephone Encounter (Signed)
Patient had her prescription transferred to a pharmacy in [facility] when she was there. Her pharmacy said they can't transfer it back and they need a new prescription sent to them. Patient would like to know if it can be sent. She would like a call back at [phone].

## 2023-04-24 NOTE — Telephone Encounter (Signed)
Pharmacy also called as well regarding sending in a new prescription, since they can only do 1 transfer.

## 2023-04-24 NOTE — Telephone Encounter (Signed)
Done erx 

## 2023-04-27 ENCOUNTER — Ambulatory Visit
Admission: EM | Admit: 2023-04-27 | Discharge: 2023-04-27 | Disposition: A | Discharge status: Home / Self Care | Payer: 59 | Attending: Family Medicine | Admitting: Family Medicine

## 2023-04-27 ENCOUNTER — Ambulatory Visit (INDEPENDENT_AMBULATORY_CARE_PROVIDER_SITE_OTHER): Payer: 59

## 2023-04-27 DIAGNOSIS — J069 Acute upper respiratory infection, unspecified: Secondary | ICD-10-CM

## 2023-04-27 DIAGNOSIS — R059 Cough, unspecified: Secondary | ICD-10-CM | POA: Diagnosis not present

## 2023-04-27 MED ORDER — BENZONATATE 100 MG PO CAPS
100.0000 mg | ORAL_CAPSULE | Freq: Three times a day (TID) | ORAL | 0 refills | Status: DC | PRN
Start: 2023-04-27 — End: 2023-11-02

## 2023-04-27 NOTE — Discharge Instructions (Signed)
Your chest x-ray is negative for fluid or pneumonia.  Take benzonatate 100 mg, 1 tab every 8 hours as needed for cough.  Make sure you are drinking fluids

## 2023-04-27 NOTE — ED Triage Notes (Signed)
Pt presents to UC w/ c/o cough, congestion, nasal drainage x2 days. Pt reports taking advil, "natural tea."

## 2023-04-27 NOTE — ED Provider Notes (Signed)
[facility]    CSN: [id number] Arrival date & time: 04/27/23  1356      History   Chief Complaint No chief complaint on file.   HPI Carmen Allen is a 62 y.o. female.   HPI Here for nasal drainage and sore throat and cough.  Symptoms began on November 27.  She also feels very tired.  No fever or chills. No nausea or vomiting or diarrhea.  She is not sure if she has heard wheezing but she has felt or heard a gurgling in her chest a couple of times   No allergies to medications  No history of diabetes or hypertension.  She did do a home flu and COVID test this morning that was negative. Past Medical History:  Diagnosis Date   Allergy    Depression    Leukocytosis    reports this way for as long as she can remember   Lumbar degenerative disc disease 10/10/2012   Migraine    No blood products 11/24/2014   Jehovah's witness     Patient Active Problem List   Diagnosis Date Noted   Acute cough 01/19/2023   Upper back pain on right side 09/19/2021   B12 deficiency 09/19/2021   Osteopenia 12/30/2020   Vitamin D deficiency 12/28/2020   Encounter for well adult exam with abnormal findings 06/29/2020   Anxiety 06/29/2020   Hyperlipidemia 01/07/2019   Insomnia, unspecified 06/06/2017   No blood products 11/24/2014   Lumbar degenerative disc disease 10/10/2012   Depression, major, 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      Home Medications    Prior to Admission medications   Medication Sig Start Date End Date Taking? Authorizing Provider  benzonatate (TESSALON) 100 MG capsule Take 1 capsule (100 mg total) by mouth 3 (three) times daily as needed for cough. 04/27/23  Yes Zenia Resides, MD  clonazePAM (KLONOPIN) 0.5 MG tablet TAKE  1/2 TO 1 (ONE-HALF TO ONE) TABLET BY MOUTH EVERY DAY AT BEDTIME AS NEEDED FOR ANXIETY 04/24/23  Yes Corwin Levins, MD  imipramine (TOFRANIL) 50 MG tablet TAKE 2 TABLETS BY MOUTH AT BEDTIME 02/14/23  Yes Corwin Levins, MD  cetirizine (ZYRTEC ALLERGY) 10 MG tablet Take 1 tablet (10 mg total) by mouth at bedtime. 06/20/21 12/16/21  Theadora Rama Scales, PA-C  Evolocumab (REPATHA SURECLICK) 140 MG/ML SOAJ Inject 140 mg into the skin every 14 (fourteen) days. 10/31/22   Corwin Levins, MD  fluticasone (FLONASE) 50 MCG/ACT nasal spray Place 2 sprays into both nostrils daily. 06/20/21   Theadora Rama Scales, PA-C    Family History Family History  Problem Relation Age of Onset   Cancer Mother    Diabetes Father    Mental illness Sister        anxiety   Mental illness Sister        ADHD   Colon cancer Neg Hx    Esophageal cancer Neg Hx    Rectal cancer Neg Hx    Stomach cancer Neg Hx     Social History Social History   Tobacco Use   Smoking status: Never   Smokeless tobacco: Never  Vaping Use   Vaping status: Never Used  Substance Use Topics   Alcohol  use: Yes    Alcohol/week: 5.0 standard drinks of alcohol    Types: 5 Glasses of wine per week   Drug use: No     Allergies   Patient has no known allergies.   Review of Systems Review of Systems   Physical Exam Triage Vital Signs ED Triage Vitals  Encounter Vitals Group     BP 04/27/23 1410 123/75     Systolic BP Percentile --      Diastolic BP Percentile --      Pulse Rate 04/27/23 1410 (!) 107     Resp 04/27/23 1410 16     Temp 04/27/23 1410 98.9 F (37.2 C)     Temp Source 04/27/23 1410 Oral     SpO2 04/27/23 1410 95 %     Weight --      Height --      Head Circumference --      Peak Flow --      Pain Score 04/27/23 1408 0     Pain Loc --      Pain Education --      Exclude from Growth Chart --    No data found.  Updated Vital Signs BP 123/75 (BP Location: Left Arm)   Pulse (!) 107   Temp 98.9 F (37.2 C)  (Oral)   Resp 16   SpO2 95%   Visual Acuity Right Eye Distance:   Left Eye Distance:   Bilateral Distance:    Right Eye Near:   Left Eye Near:    Bilateral Near:     Physical Exam Vitals reviewed.  Constitutional:      General: She is not in acute distress.    Appearance: She is not ill-appearing, toxic-appearing or diaphoretic.  HENT:     Right Ear: Tympanic membrane and ear canal normal.     Left Ear: Tympanic membrane and ear canal normal.     Nose: Congestion present.     Mouth/Throat:     Mouth: Mucous membranes are moist.     Comments: No erythema or asymmetry. Eyes:     Extraocular Movements: Extraocular movements intact.     Conjunctiva/sclera: Conjunctivae normal.     Pupils: Pupils are equal, round, and reactive to light.  Cardiovascular:     Rate and Rhythm: Normal rate and regular rhythm.     Heart sounds: No murmur heard. Pulmonary:     Effort: Pulmonary effort is normal. No respiratory distress.     Breath sounds: No stridor. No wheezing, rhonchi or rales.  Musculoskeletal:     Cervical back: Neck supple.  Lymphadenopathy:     Cervical: No cervical adenopathy.  Skin:    Capillary Refill: Capillary refill takes less than 2 seconds.     Coloration: Skin is not jaundiced or pale.  Neurological:     General: No focal deficit present.     Mental Status: She is alert and oriented to person, place, and time.  Psychiatric:        Behavior: Behavior normal.      UC Treatments / Results  Labs (all labs ordered are listed, but only abnormal results are displayed) Labs Reviewed - No data to display  EKG   Radiology DG Chest 2 View  Result Date: 04/27/2023 CLINICAL DATA:  Cough and gurgling EXAM: CHEST - 2 VIEW COMPARISON:  X-ray 01/16/2023. older exams as well. FINDINGS: The heart size and mediastinal contours are within normal limits. No consolidation, pneumothorax or effusion. No edema. Slight curvature  of the spine with some mild degenerative  changes. IMPRESSION: No acute cardiopulmonary disease. Electronically Signed   By: Karen Kays M.D.   On: 04/27/2023 15:18    Procedures Procedures (including critical care time)  Medications Ordered in UC Medications - No data to display  Initial Impression / Assessment and Plan / UC Course  I have reviewed the triage vital signs and the nursing notes.  Pertinent labs & imaging results that were available during my care of the patient were reviewed by me and considered in my medical decision making (see chart for details).     Chest x-ray is read as negative.  No further testing done since she is already done a COVID and flu test at home.  She requested to go home right as her x-ray had been read.  She was given results by nursing staff.  We let her know we would be sending in medicine for cough.  Tessalon Perles are sent in. Final Clinical Impressions(s) / UC Diagnoses   Final diagnoses:  Viral URI     Discharge Instructions      Your chest x-ray is negative for fluid or pneumonia.  Take benzonatate 100 mg, 1 tab every 8 hours as needed for cough.  Make sure you are drinking fluids     ED Prescriptions     Medication Sig Dispense Auth. Provider   benzonatate (TESSALON) 100 MG capsule Take 1 capsule (100 mg total) by mouth 3 (three) times daily as needed for cough. 21 capsule Zenia Resides, MD      PDMP not reviewed this encounter.   Zenia Resides, MD 04/27/23 [id number]

## 2023-06-25 ENCOUNTER — Telehealth: Payer: Self-pay

## 2023-06-25 ENCOUNTER — Other Ambulatory Visit: Payer: Self-pay

## 2023-06-25 ENCOUNTER — Ambulatory Visit
Admission: EM | Admit: 2023-06-25 | Discharge: 2023-06-25 | Disposition: A | Discharge status: Home / Self Care | Payer: 59 | Attending: Internal Medicine | Admitting: Internal Medicine

## 2023-06-25 DIAGNOSIS — R0981 Nasal congestion: Secondary | ICD-10-CM | POA: Diagnosis not present

## 2023-06-25 DIAGNOSIS — F325 Major depressive disorder, single episode, in full remission: Secondary | ICD-10-CM

## 2023-06-25 DIAGNOSIS — H66003 Acute suppurative otitis media without spontaneous rupture of ear drum, bilateral: Secondary | ICD-10-CM

## 2023-06-25 MED ORDER — PREDNISONE 20 MG PO TABS: 40.0000 mg | ORAL_TABLET | Freq: Every day | ORAL | 0 refills | Status: AC

## 2023-06-25 MED ORDER — AMOXICILLIN 875 MG PO TABS: 875.0000 mg | ORAL_TABLET | Freq: Two times a day (BID) | ORAL | 0 refills | Status: AC

## 2023-06-25 MED ORDER — IMIPRAMINE HCL 50 MG PO TABS: 100.0000 mg | ORAL_TABLET | Freq: Every day | ORAL | 3 refills | Status: DC

## 2023-06-25 NOTE — Telephone Encounter (Signed)
Copied from CRM [id number]. Topic: Clinical - Prescription Issue >> Jun 25, 2023 11:14 AM Carmen Allen wrote: Reason for CRM: Patient states her pharmacy has no re-fills for imipramine (TOFRANIL) 50 MG tablet but there is 3 re-fills according to her chart. Patient is out of this medication and states her pharmacy has already sent over 2 requests.

## 2023-06-25 NOTE — Discharge Instructions (Addendum)
Bilateral ears with mid ear effusions consistent with otitis media (ear infections). This normally requires antibiotics. We will treat with the following:  Amoxicillin 875 mg twice daily for 7 days. This is an antibiotic. Take with food.  Prednisone 40 mg (2 tablets) daily for 5 days. Take this in the morning. This is a steroid and helps with inflammation.  Stay hydrated, drink plenty of water Return to urgent care or PCP if symptoms worsen or fail to resolve.

## 2023-06-25 NOTE — ED Provider Notes (Signed)
[facility]    CSN: [id number] Arrival date & time: 06/25/23  1438      History   Chief Complaint No chief complaint on file.   HPI Carmen Allen is a 63 y.o. female.   63 year old female who presents to urgent care with complaints of bilateral ear pain.  She reports that the ear pain just started today and it is significant.  She has had rhinorrhea and nasal congestion for the last month.  She reports that she feels like she has had some aspect of an upper respiratory condition for several months now and is wondering if it could be allergens within her home.  She has been treated with antibiotics for the congestion in the past but it seems to come right back.  The ear pain is new and she has not had this previously.  She denies any fevers or chills.  She denies shortness of breath, cough, chest pain, nausea, vomiting, sore throat.     Past Medical History:  Diagnosis Date   Allergy    Depression    Leukocytosis    reports this way for as long as she can remember   Lumbar degenerative disc disease 10/10/2012   Migraine    No blood products 11/24/2014   Jehovah's witness     Patient Active Problem List   Diagnosis Date Noted   Acute cough 01/19/2023   Upper back pain on right side 09/19/2021   B12 deficiency 09/19/2021   Osteopenia 12/30/2020   Vitamin D deficiency 12/28/2020   Encounter for well adult exam with abnormal findings 06/29/2020   Anxiety 06/29/2020   Hyperlipidemia 01/07/2019   Insomnia, unspecified 06/06/2017   No blood products 11/24/2014   Lumbar degenerative disc disease 10/10/2012   Depression, major, 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      Home Medications    Prior to Admission medications   Medication Sig  Start Date End Date Taking? Authorizing Provider  benzonatate (TESSALON) 100 MG capsule Take 1 capsule (100 mg total) by mouth 3 (three) times daily as needed for cough. 04/27/23   Zenia Resides, MD  cetirizine (ZYRTEC ALLERGY) 10 MG tablet Take 1 tablet (10 mg total) by mouth at bedtime. 06/20/21 12/16/21  Theadora Rama Scales, PA-C  clonazePAM (KLONOPIN) 0.5 MG tablet TAKE 1/2 TO 1 (ONE-HALF TO ONE) TABLET BY MOUTH EVERY DAY AT BEDTIME AS NEEDED FOR ANXIETY 04/24/23   Corwin Levins, MD  Evolocumab (REPATHA SURECLICK) 140 MG/ML SOAJ Inject 140 mg into the skin every 14 (fourteen) days. 10/31/22   Corwin Levins, MD  fluticasone (FLONASE) 50 MCG/ACT nasal spray Place 2 sprays into both nostrils daily. 06/20/21   Theadora Rama Scales, PA-C  imipramine (TOFRANIL) 50 MG tablet Take 2 tablets (100 mg total) by mouth at bedtime. 06/25/23   Corwin Levins, MD    Family History Family History  Problem Relation Age of Onset   Cancer Mother    Diabetes Father    Mental illness Sister        anxiety   Mental illness Sister        ADHD   Colon cancer Neg Hx    Esophageal cancer Neg Hx    Rectal cancer Neg Hx  Stomach cancer Neg Hx     Social History Social History   Tobacco Use   Smoking status: Never   Smokeless tobacco: Never  Vaping Use   Vaping status: Never Used  Substance Use Topics   Alcohol use: Yes    Alcohol/week: 5.0 standard drinks of alcohol    Types: 5 Glasses of wine per week   Drug use: No     Allergies   Patient has no known allergies.   Review of Systems Review of Systems  Constitutional:  Negative for chills and fever.  HENT:  Positive for congestion, ear pain and rhinorrhea. Negative for sore throat.   Eyes:  Negative for pain and visual disturbance.  Respiratory:  Negative for cough and shortness of breath.   Cardiovascular:  Negative for chest pain and palpitations.  Gastrointestinal:  Negative for abdominal pain and vomiting.  Genitourinary:   Negative for dysuria and hematuria.  Musculoskeletal:  Negative for arthralgias and back pain.  Skin:  Negative for color change and rash.  Neurological:  Negative for seizures and syncope.  All other systems reviewed and are negative.    Physical Exam Triage Vital Signs ED Triage Vitals  Encounter Vitals Group     BP 06/25/23 1520 123/73     Systolic BP Percentile --      Diastolic BP Percentile --      Pulse Rate 06/25/23 1520 100     Resp 06/25/23 1520 16     Temp 06/25/23 1520 98.7 F (37.1 C)     Temp src --      SpO2 06/25/23 1520 99 %     Weight --      Height --      Head Circumference --      Peak Flow --      Pain Score 06/25/23 1523 6     Pain Loc --      Pain Education --      Exclude from Growth Chart --    No data found.  Updated Vital Signs BP 123/73   Pulse 100   Temp 98.7 F (37.1 C)   Resp 16   SpO2 99%   Visual Acuity Right Eye Distance:   Left Eye Distance:   Bilateral Distance:    Right Eye Near:   Left Eye Near:    Bilateral Near:     Physical Exam Vitals and nursing note reviewed.  Constitutional:      General: She is not in acute distress.    Appearance: She is well-developed.  HENT:     Head: Normocephalic and atraumatic.     Right Ear: A middle ear effusion is present. Tympanic membrane is erythematous.     Left Ear: A middle ear effusion is present. Tympanic membrane is erythematous.     Nose: Nose normal.     Mouth/Throat:     Mouth: Mucous membranes are moist.  Eyes:     Conjunctiva/sclera: Conjunctivae normal.  Cardiovascular:     Rate and Rhythm: Normal rate and regular rhythm.     Heart sounds: No murmur heard. Pulmonary:     Effort: Pulmonary effort is normal. No respiratory distress.     Breath sounds: Normal breath sounds.  Abdominal:     Palpations: Abdomen is soft.     Tenderness: There is no abdominal tenderness.  Musculoskeletal:        General: No swelling.     Cervical back: Neck supple.  Skin:  General: Skin is warm and dry.     Capillary Refill: Capillary refill takes less than 2 seconds.  Neurological:     Mental Status: She is alert.  Psychiatric:        Mood and Affect: Mood normal.      UC Treatments / Results  Labs (all labs ordered are listed, but only abnormal results are displayed) Labs Reviewed - No data to display  EKG   Radiology No results found.  Procedures Procedures (including critical care time)  Medications Ordered in UC Medications - No data to display  Initial Impression / Assessment and Plan / UC Course  I have reviewed the triage vital signs and the nursing notes.  Pertinent labs & imaging results that were available during my care of the patient were reviewed by me and considered in my medical decision making (see chart for details).     Non-recurrent acute suppurative otitis media of both ears without spontaneous rupture of tympanic membranes  Nasal congestion   Bilateral ears with mid ear effusions consistent with otitis media (ear infections). This normally requires antibiotics. We will treat with the following:  Amoxicillin 875 mg twice daily for 7 days. This is an antibiotic. Take with food.  Prednisone 40 mg (2 tablets) daily for 5 days. Take this in the morning. This is a steroid and helps with inflammation.  Stay hydrated, drink plenty of water Return to urgent care or PCP if symptoms worsen or fail to resolve.    Final Clinical Impressions(s) / UC Diagnoses   Final diagnoses:  None   Discharge Instructions   None    ED Prescriptions   None    PDMP not reviewed this encounter.   Landis Martins, [place] 06/25/23 1617

## 2023-06-25 NOTE — Telephone Encounter (Signed)
Called and let Pt know refills have been sent.

## 2023-06-25 NOTE — ED Triage Notes (Signed)
C/o sinus congestion x 1 month. Has also had rhinorrhea x 1 month. Woke up this morning with bil ear pain. No fever. Took tylenol this morning.

## 2023-10-07 ENCOUNTER — Other Ambulatory Visit: Payer: Self-pay | Admitting: Internal Medicine

## 2023-10-07 DIAGNOSIS — F325 Major depressive disorder, single episode, in full remission: Secondary | ICD-10-CM

## 2023-10-08 ENCOUNTER — Other Ambulatory Visit: Payer: Self-pay

## 2023-10-09 ENCOUNTER — Other Ambulatory Visit: Payer: Self-pay | Admitting: Internal Medicine

## 2023-10-09 DIAGNOSIS — F325 Major depressive disorder, single episode, in full remission: Secondary | ICD-10-CM

## 2023-11-02 ENCOUNTER — Ambulatory Visit (INDEPENDENT_AMBULATORY_CARE_PROVIDER_SITE_OTHER): Payer: 59 | Admitting: Internal Medicine

## 2023-11-02 ENCOUNTER — Ambulatory Visit: Payer: Self-pay | Admitting: Internal Medicine

## 2023-11-02 ENCOUNTER — Encounter: Payer: Self-pay | Admitting: Internal Medicine

## 2023-11-02 ENCOUNTER — Other Ambulatory Visit

## 2023-11-02 VITALS — BP 120/74 | HR 88 | Temp 97.9°F | Ht 67.5 in | Wt 156.0 lb

## 2023-11-02 DIAGNOSIS — M8580 Other specified disorders of bone density and structure, unspecified site: Secondary | ICD-10-CM | POA: Diagnosis not present

## 2023-11-02 DIAGNOSIS — I2583 Coronary atherosclerosis due to lipid rich plaque: Secondary | ICD-10-CM | POA: Diagnosis not present

## 2023-11-02 DIAGNOSIS — Z860100 Personal history of colon polyps, unspecified: Secondary | ICD-10-CM

## 2023-11-02 DIAGNOSIS — M25551 Pain in right hip: Secondary | ICD-10-CM | POA: Insufficient documentation

## 2023-11-02 DIAGNOSIS — R739 Hyperglycemia, unspecified: Secondary | ICD-10-CM | POA: Diagnosis not present

## 2023-11-02 DIAGNOSIS — E7800 Pure hypercholesterolemia, unspecified: Secondary | ICD-10-CM | POA: Diagnosis not present

## 2023-11-02 DIAGNOSIS — I2510 Atherosclerotic heart disease of native coronary artery without angina pectoris: Secondary | ICD-10-CM

## 2023-11-02 DIAGNOSIS — E538 Deficiency of other specified B group vitamins: Secondary | ICD-10-CM

## 2023-11-02 DIAGNOSIS — M25552 Pain in left hip: Secondary | ICD-10-CM | POA: Diagnosis not present

## 2023-11-02 DIAGNOSIS — E559 Vitamin D deficiency, unspecified: Secondary | ICD-10-CM

## 2023-11-02 DIAGNOSIS — Z0001 Encounter for general adult medical examination with abnormal findings: Secondary | ICD-10-CM

## 2023-11-02 DIAGNOSIS — Z0000 Encounter for general adult medical examination without abnormal findings: Secondary | ICD-10-CM

## 2023-11-02 LAB — URINALYSIS, ROUTINE W REFLEX MICROSCOPIC
Bilirubin Urine: NEGATIVE
Hgb urine dipstick: NEGATIVE
Ketones, ur: NEGATIVE
Leukocytes,Ua: NEGATIVE
Nitrite: NEGATIVE
Specific Gravity, Urine: <=1.005 — AB (ref 1.000–1.030)
Total Protein, Urine: NEGATIVE
Urine Glucose: NEGATIVE
Urobilinogen, UA: 0.2 (ref 0.0–1.0)
WBC, UA: NONE SEEN (ref 0–?)
pH: 6 (ref 5.0–8.0)

## 2023-11-02 LAB — BASIC METABOLIC PANEL WITH GFR
BUN: 17 mg/dL (ref 6–23)
CO2: 32 meq/L (ref 19–32)
Calcium: 9.2 mg/dL (ref 8.4–10.5)
Chloride: 96 meq/L (ref 96–112)
Creatinine, Ser: 0.88 mg/dL (ref 0.40–1.20)
GFR: 70.29 mL/min (ref >60.00)
Glucose, Bld: 105 mg/dL — ABNORMAL HIGH (ref 70–99)
Potassium: 4 meq/L (ref 3.5–5.1)
Sodium: 135 meq/L (ref 135–145)

## 2023-11-02 LAB — CBC WITH DIFFERENTIAL/PLATELET
Basophils Absolute: 0 10*3/uL (ref 0.0–0.1)
Basophils Relative: 0.4 % (ref 0.0–3.0)
Eosinophils Absolute: 0.1 10*3/uL (ref 0.0–0.7)
Eosinophils Relative: 2.8 % (ref 0.0–5.0)
HCT: 38.5 % (ref 36.0–46.0)
Hemoglobin: 13 g/dL (ref 12.0–15.0)
Lymphocytes Relative: 51.5 % — ABNORMAL HIGH (ref 12.0–46.0)
Lymphs Abs: 1.8 10*3/uL (ref 0.7–4.0)
MCHC: 33.8 g/dL (ref 30.0–36.0)
MCV: 97 fl (ref 78.0–100.0)
Monocytes Absolute: 0.4 10*3/uL (ref 0.1–1.0)
Monocytes Relative: 10.2 % (ref 3.0–12.0)
Neutro Abs: 1.3 10*3/uL — ABNORMAL LOW (ref 1.4–7.7)
Neutrophils Relative %: 35.1 % — ABNORMAL LOW (ref 43.0–77.0)
Platelets: 238 10*3/uL (ref 150.0–400.0)
RBC: 3.97 Mil/uL (ref 3.87–5.11)
RDW: 12.5 % (ref 11.5–15.5)
WBC: 3.6 10*3/uL — ABNORMAL LOW (ref 4.0–10.5)

## 2023-11-02 LAB — VITAMIN D 25 HYDROXY (VIT D DEFICIENCY, FRACTURES): VITD: 41.56 ng/mL (ref 30.00–100.00)

## 2023-11-02 LAB — HEPATIC FUNCTION PANEL
ALT: 20 U/L (ref 0–35)
AST: 21 U/L (ref 0–37)
Albumin: 4.7 g/dL (ref 3.5–5.2)
Alkaline Phosphatase: 41 U/L (ref 39–117)
Bilirubin, Direct: 0.1 mg/dL (ref 0.0–0.3)
Total Bilirubin: 0.7 mg/dL (ref 0.2–1.2)
Total Protein: 6.9 g/dL (ref 6.0–8.3)

## 2023-11-02 LAB — VITAMIN B12: Vitamin B-12: 472 pg/mL (ref 211–911)

## 2023-11-02 LAB — LIPID PANEL
Cholesterol: 243 mg/dL — ABNORMAL HIGH (ref 0–200)
HDL: 79.8 mg/dL (ref >39.00)
LDL Cholesterol: 147 mg/dL — ABNORMAL HIGH (ref 0–99)
NonHDL: 163.4
Total CHOL/HDL Ratio: 3
Triglycerides: 80 mg/dL (ref 0.0–149.0)
VLDL: 16 mg/dL (ref 0.0–40.0)

## 2023-11-02 LAB — HEMOGLOBIN A1C: Hgb A1c MFr Bld: 5.4 % (ref 4.6–6.5)

## 2023-11-02 LAB — TSH: TSH: 2.12 u[IU]/mL (ref 0.35–5.50)

## 2023-11-02 NOTE — Assessment & Plan Note (Signed)
 Last vitamin D  Lab Results  Component Value Date   VD25OH 41.56 11/02/2023   Stable, cont oral replacement

## 2023-11-02 NOTE — Patient Instructions (Addendum)
 Please remember to call GYN for your yearly exam and mammogram  Please let us  know if you change your mind about starting a statin such as Crestor, Repatha , or Nexlitol / nexlizet    Please continue all other medications as before, and refills have been done if requested.  Please have the pharmacy call with any other refills you may need.  Please continue your efforts at being more active, low cholesterol diet, and weight control.  You are otherwise up to date with prevention measures today.  Please keep your appointments with your specialists as you may have planned  You will be contacted regarding the referral for: colonoscopy, bone density and Dr Elodia Hailstone  Please go to the LAB at the blood drawing area for the tests to be done at [facility] lab in the basement  You will be contacted by phone if any changes need to be made immediately.  Otherwise, you will receive a letter about your results with an explanation, but please check with MyChart first.  Please make an Appointment to return for your 1 year visit, or sooner if needed

## 2023-11-02 NOTE — Assessment & Plan Note (Addendum)
 Age and sex appropriate education and counseling updated with regular exercise and diet Referrals for preventative services - for colonoscopy, DXA, pt to call for GYN appt and mammogram Immunizations addressed - for tdap and shingrix at pharmacy Smoking counseling  - none needed Evidence for depression or other mood disorder - none significant Most recent labs reviewed. I have personally reviewed and have noted: 1) the patient's medical and social history 2) The patient's current medications and supplements 3) The patient's height, weight, and BMI have been recorded in the chart

## 2023-11-02 NOTE — Progress Notes (Signed)
 Patient ID: Carmen Allen, female   DOB: 06-12-1960, 63 y.o.   MRN: [id number]         Chief Complaint:: wellness exam and hld, low vit d and b12, CAD, bilateral hip bursitis       HPI:  Carmen Allen is a 63 y.o. 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  Pt denies chest pain, increased sob or doe, wheezing, orthopnea, PND, increased LE swelling, palpitations, dizziness or syncope.   Pt denies polydipsia, polyuria, or new focal neuro s/s.    Pt denies fever, wt loss, night sweats, loss of appetite, or other constitutional symptoms  Conts to run about 3 miles every few days, limited somewhat with bilateral lateral hip pains, worse to lie on either side, has seen ortho with dx of hip bursitis.      Wt Readings from Last 3 Encounters:  11/02/23 156 lb (70.8 kg)  10/31/22 149 lb (67.6 kg)  12/16/21 146 lb 9.6 oz (66.5 kg)   BP Readings from Last 3 Encounters:  11/02/23 120/74  06/25/23 123/73  04/27/23 123/75   Immunization History  Administered Date(s) Administered   PFIZER(Purple Top)SARS-COV-2 Vaccination 08/14/2019, 09/09/2019, 05/20/2020   Tdap 10/10/2012   Health Maintenance Due  Topic Date Due   Pneumococcal Vaccine 11-29 Years old (1 of 2 - PCV) Never done   Cervical Cancer Screening (HPV/Pap Cotest)  Never done   Zoster Vaccines- Shingrix (1 of 2) Never done   MAMMOGRAM  05/12/2022   DTaP/Tdap/Td (2 - Td or Tdap) 10/11/2022   Colonoscopy  09/22/2023      Past Medical History:  Diagnosis Date   Allergy    Depression    Leukocytosis    reports this way for as long as she can remember   Lumbar degenerative disc disease 10/10/2012   Migraine    No blood products 11/24/2014   [employer]ehov[employer]'s witness    Past Surgical History:   Procedure Laterality Date   ABDOMINAL HYSTERECTOMY     oophorectomy, endometriosis   COLONOSCOPY  2014    reports that she has never smoked. She has never used smokeless tobacco. She reports current alcohol use of about 5.0 standard drinks of alcohol per week. She reports that she does not use drugs. family history includes Cancer in her mother; Diabetes in her father; Mental illness in her sister and sister. No Known Allergies Current Outpatient Medications on File Prior to Visit  Medication Sig Dispense Refill   clonazePAM  (KLONOPIN ) 0.5 MG tablet TAKE 1/2 TO 1 (ONE-HALF TO ONE) TABLET BY MOUTH EVERY DAY AT BEDTIME AS NEEDED FOR ANXIETY 90 tablet 2   fluticasone  (FLONASE ) 50 MCG/ACT nasal spray Place 2 sprays into both nostrils daily. 18 mL 0   imipramine  (TOFRANIL ) 50 MG tablet TAKE 2 TABLETS BY MOUTH AT BEDTIME 60 tablet 0   cetirizine  (ZYRTEC  ALLERGY) 10 MG tablet Take 1 tablet (10 mg total) by mouth at bedtime. 30 tablet 2   No current facility-administered medications on file prior to visit.        ROS:  All others reviewed and negative.  Objective  PE:  BP 120/74 (BP Location: Right Arm, Patient Position: Sitting, Cuff Size: Normal)   Pulse 88   Temp 97.9 F (36.6 C) (Oral)   Ht 5' 7.5" (1.715 m)   Wt 156 lb (70.8 kg)   SpO2 99%   BMI 24.07 kg/m                 Constitutional: Pt appears in NAD               HENT: Head: NCAT.                Right Ear: External ear normal.                 Left Ear: External ear normal.                Eyes: . Pupils are equal, round, and reactive to light. Conjunctivae and EOM are normal               Nose: without d/c or deformity               Neck: Neck supple. Gross normal ROM               Cardiovascular: Normal rate and regular rhythm.                 Pulmonary/Chest: Effort normal and breath sounds without rales or wheezing.                Abd:  Soft, NT, ND, + BS, no organomegaly               Neurological: Pt is alert.  At baseline orientation, motor grossly intact               Skin: Skin is warm. No rashes, no other new lesions, LE edema - none               Psychiatric: Pt behavior is normal without agitation   Micro: none  Cardiac tracings I have personally interpreted today:  none  Pertinent Radiological findings (summarize): none   Lab Results  Component Value Date   WBC 3.6 (L) 11/02/2023   HGB 13.0 11/02/2023   HCT 38.5 11/02/2023   PLT 238.0 11/02/2023   GLUCOSE 105 (H) 11/02/2023   CHOL 243 (H) 11/02/2023   TRIG 80.0 11/02/2023   HDL 79.80 11/02/2023   LDLDIRECT 106.3 07/12/2011   LDLCALC 147 (H) 11/02/2023   ALT 20 11/02/2023   AST 21 11/02/2023   NA 135 11/02/2023   K 4.0 11/02/2023   CL 96 11/02/2023   CREATININE 0.88 11/02/2023   BUN 17 11/02/2023   CO2 32 11/02/2023   TSH 2.12 11/02/2023   HGBA1C 5.4 11/02/2023   Assessment/Plan:  Carmen Allen is a 63 y.o. White or Caucasian [1] female with  has a past medical history of Allergy, Depression, Leukocytosis, Lumbar degenerative disc disease (10/10/2012), Migraine, and No blood products (11/24/2014).  CAD (coronary artery disease) Stable overall, refer back to Dr Elodia Hailstone as is due  Encounter for well adult exam with abnormal findings Age and sex appropriate education and counseling updated with regular exercise and diet Referrals for preventative services - for colonoscopy, DXA, pt to call for GYN appt and mammogram Immunizations addressed - for tdap and shingrix at pharmacy Smoking counseling  - none needed Evidence for depression or other mood disorder - none significant Most recent labs reviewed. I have personally reviewed and have noted: 1)  the patient's medical and social history 2) The patient's current medications and supplements 3) The patient's height, weight, and BMI have been recorded in the chart   B12 deficiency Lab Results  Component Value Date   VITAMINB12 472 11/02/2023   Stable, cont oral  replacement - b12 1000 mcg qd   Hyperlipidemia Lab Results  Component Value Date   LDLCALC 147 (H) 11/02/2023   Uncontrolled, goal ldl < 70,  pt decliens statin or repatha  or nexlitol for now, for lower chol diet  Osteopenia Also for f/u DXA  Vitamin D  deficiency Last vitamin D  Lab Results  Component Value Date   VD25OH 41.56 11/02/2023   Stable, cont oral replacement   Bilateral hip pain Xc//w recurrent bursitis - for f/u with ortho as before (murphy wainer)  Followup: Return in about 1 year (around 11/01/2024).  Rosalia Colonel, MD 11/02/2023 10:03 PM [facility]

## 2023-11-02 NOTE — Assessment & Plan Note (Signed)
 Xc//w recurrent bursitis - for f/u with ortho as before (murphy wainer)

## 2023-11-02 NOTE — Assessment & Plan Note (Signed)
 Lab Results  Component Value Date   LDLCALC 147 (H) 11/02/2023   Uncontrolled, goal ldl < 70,  pt decliens statin or repatha  or nexlitol for now, for lower chol diet

## 2023-11-02 NOTE — Assessment & Plan Note (Signed)
 Stable overall, refer back to Dr Elodia Hailstone as is due

## 2023-11-02 NOTE — Assessment & Plan Note (Signed)
 Lab Results  Component Value Date   VITAMINB12 472 11/02/2023   Stable, cont oral replacement - b12 1000 mcg qd

## 2023-11-02 NOTE — Assessment & Plan Note (Signed)
Also for f/u DXA

## 2023-11-05 ENCOUNTER — Other Ambulatory Visit: Payer: Self-pay | Admitting: Internal Medicine

## 2023-11-05 ENCOUNTER — Other Ambulatory Visit: Payer: Self-pay

## 2023-11-05 DIAGNOSIS — F325 Major depressive disorder, single episode, in full remission: Secondary | ICD-10-CM

## 2023-11-09 ENCOUNTER — Other Ambulatory Visit: Payer: Self-pay | Admitting: Internal Medicine

## 2023-11-09 DIAGNOSIS — G4700 Insomnia, unspecified: Secondary | ICD-10-CM

## 2023-11-09 NOTE — Telephone Encounter (Signed)
 Copied from CRM [id number]. Topic: Clinical - Medication Question >> Nov 09, 2023 10:35 AM Kita Perish H wrote: Reason for CRM: Patient was calling to check for refill for her clonazePAM  (KLONOPIN ) 0.5 MG tablet, advised patient refill request was received by pharmacy today and can take up to 3 business days. Patient states she is out and gets sick when she doesn't take medication and wants to know if it can be sent sooner.  [place] [phone]   ----------------------------------------------------------------------- From previous Reason for Contact - Medication Refill: Medication:   Has the patient contacted their pharmacy?   (Agent: If no, request that the patient contact the pharmacy for the refill. If patient does not wish to contact the pharmacy document the reason why and proceed with request.) (Agent: If yes, when and what did the pharmacy advise?)  This is the patient's preferred pharmacy:  [facility] [address], [place] - [address] [place] [address] Phone: [phone] Fax: [phone]  [id number] - [address] [place] - [address] [place] [address] Phone: [phone] Fax: [phone]  Is this the correct pharmacy for this prescription?   If no, delete pharmacy and type the correct one.   Has the prescription been filled recently?    Is the patient out of the medication?    Has the patient been seen for an appointment in the last year OR does the patient have an upcoming appointment?    Can we respond through MyChart?    Agent: Please be advised that Rx refills may take up to 3 business days. We ask that you follow-up with your pharmacy.

## 2023-12-02 ENCOUNTER — Other Ambulatory Visit: Payer: Self-pay | Admitting: Internal Medicine

## 2023-12-02 DIAGNOSIS — F325 Major depressive disorder, single episode, in full remission: Secondary | ICD-10-CM

## 2023-12-07 ENCOUNTER — Encounter: Payer: Self-pay | Admitting: Internal Medicine

## 2023-12-08 ENCOUNTER — Other Ambulatory Visit: Payer: Self-pay | Admitting: Family Medicine

## 2023-12-08 DIAGNOSIS — G4700 Insomnia, unspecified: Secondary | ICD-10-CM

## 2024-01-05 ENCOUNTER — Other Ambulatory Visit: Payer: Self-pay | Admitting: Internal Medicine

## 2024-01-05 DIAGNOSIS — F325 Major depressive disorder, single episode, in full remission: Secondary | ICD-10-CM

## 2024-01-22 ENCOUNTER — Inpatient Hospital Stay: Admission: RE | Admit: 2024-01-22 | Source: Ambulatory Visit

## 2024-01-29 ENCOUNTER — Other Ambulatory Visit

## 2024-02-02 ENCOUNTER — Other Ambulatory Visit: Payer: Self-pay | Admitting: Internal Medicine

## 2024-02-02 DIAGNOSIS — F325 Major depressive disorder, single episode, in full remission: Secondary | ICD-10-CM

## 2024-03-05 ENCOUNTER — Other Ambulatory Visit: Payer: Self-pay

## 2024-03-05 ENCOUNTER — Other Ambulatory Visit: Payer: Self-pay | Admitting: Internal Medicine

## 2024-03-05 DIAGNOSIS — F325 Major depressive disorder, single episode, in full remission: Secondary | ICD-10-CM

## 2024-03-06 ENCOUNTER — Other Ambulatory Visit: Payer: Self-pay | Admitting: Internal Medicine

## 2024-03-06 DIAGNOSIS — Z1231 Encounter for screening mammogram for malignant neoplasm of breast: Secondary | ICD-10-CM

## 2024-03-18 ENCOUNTER — Ambulatory Visit

## 2024-04-01 ENCOUNTER — Ambulatory Visit
Admission: RE | Admit: 2024-04-01 | Discharge: 2024-04-01 | Disposition: A | Source: Ambulatory Visit | Attending: Internal Medicine | Admitting: Internal Medicine

## 2024-04-01 DIAGNOSIS — Z1231 Encounter for screening mammogram for malignant neoplasm of breast: Secondary | ICD-10-CM | POA: Diagnosis not present

## 2024-04-06 ENCOUNTER — Other Ambulatory Visit: Payer: Self-pay | Admitting: Internal Medicine

## 2024-04-06 DIAGNOSIS — F325 Major depressive disorder, single episode, in full remission: Secondary | ICD-10-CM

## 2024-04-07 ENCOUNTER — Other Ambulatory Visit: Payer: Self-pay

## 2024-04-17 ENCOUNTER — Other Ambulatory Visit: Payer: Self-pay | Admitting: Internal Medicine

## 2024-04-17 DIAGNOSIS — E2839 Other primary ovarian failure: Secondary | ICD-10-CM

## 2024-05-04 ENCOUNTER — Other Ambulatory Visit: Payer: Self-pay | Admitting: Internal Medicine

## 2024-05-04 DIAGNOSIS — F325 Major depressive disorder, single episode, in full remission: Secondary | ICD-10-CM

## 2024-05-07 ENCOUNTER — Ambulatory Visit

## 2024-05-07 VITALS — Ht 67.5 in | Wt 150.0 lb

## 2024-05-07 DIAGNOSIS — Z860100 Personal history of colon polyps, unspecified: Secondary | ICD-10-CM

## 2024-05-07 MED ORDER — NA SULFATE-K SULFATE-MG SULF 17.5-3.13-1.6 GM/177ML PO SOLN: 1.0000 | Freq: Once | ORAL | 0 refills | Status: AC

## 2024-05-07 NOTE — Progress Notes (Signed)
 PCP MD at time of PV: Lynwood Rush, MD __________________________________________________________________________________________________________________________________________  No egg allergy known to patient  No soy allergy known to patient No issues known to pt with past sedation with any surgeries or procedures Patient denies ever being told they had issues or difficulty with intubation  No FH of Malignant Hyperthermia Pt is not on diet pills Pt is not on  home 02  Pt is not on blood thinners  No A fib or A flutter Have any cardiac testing pending--no  LOA: independent  No Chew or Snuff tobacco __________________________________________________________________________________________________________________________________________  Constipation: no  Prep: suprep __________________________________________________________________________________________________________________________________________  PV completed with patient. Prep instructions reviewed and provided during apt. Rx sent to preferred pharmacy.  __________________________________________________________________________________________________________________________________________  Patient's chart reviewed by Rush Schillings CNRA prior to previsit and patient appropriate for the [facility].  Previsit completed and red dot placed by patient's name on their procedure day (on provider's schedule).

## 2024-05-08 ENCOUNTER — Other Ambulatory Visit

## 2024-05-13 ENCOUNTER — Other Ambulatory Visit: Payer: Self-pay | Admitting: Internal Medicine

## 2024-05-13 DIAGNOSIS — F325 Major depressive disorder, single episode, in full remission: Secondary | ICD-10-CM

## 2024-05-14 ENCOUNTER — Encounter: Payer: Self-pay | Admitting: Internal Medicine

## 2024-05-19 ENCOUNTER — Telehealth: Payer: Self-pay | Admitting: Internal Medicine

## 2024-05-19 NOTE — Telephone Encounter (Signed)
 Good morning Dr. Avram,   I received a call from this patient stating that she has been sick since last week hoping to feel better before her procedure scheduled for tomorrow. Patient stated that she she has not been able to get better and would like to reschedule her procedure. Patient was reschedule for January the 13 th. Please advise.    Thank you.

## 2024-05-19 NOTE — Telephone Encounter (Signed)
 Okay thanks.  No charge.

## 2024-05-20 ENCOUNTER — Encounter: Admitting: Internal Medicine

## 2024-06-10 ENCOUNTER — Encounter: Admitting: Internal Medicine

## 2024-11-03 ENCOUNTER — Encounter: Admitting: Internal Medicine
